# Patient Record
Sex: Male | Born: 1955 | Race: Black or African American | Hispanic: No | Marital: Married | Smoking: Never smoker
Health system: Southern US, Community
[De-identification: ages and names within clinical notes are randomized; demographics above are authoritative.]

## PROBLEM LIST (undated history)

## (undated) DIAGNOSIS — R011 Cardiac murmur, unspecified: Secondary | ICD-10-CM

## (undated) DIAGNOSIS — M1990 Unspecified osteoarthritis, unspecified site: Secondary | ICD-10-CM

## (undated) DIAGNOSIS — I10 Essential (primary) hypertension: Secondary | ICD-10-CM

## (undated) HISTORY — PX: TESTICLE SURGERY: SHX794

## (undated) HISTORY — PX: LEG SURGERY: SHX1003

## (undated) HISTORY — PX: SHOULDER SURGERY: SHX246

---

## 2018-11-15 ENCOUNTER — Encounter (HOSPITAL_COMMUNITY): Payer: Self-pay | Admitting: Emergency Medicine

## 2018-11-15 ENCOUNTER — Emergency Department (HOSPITAL_COMMUNITY)
Admission: EM | Admit: 2018-11-15 | Discharge: 2018-11-15 | Disposition: A | Discharge status: Home / Self Care | Payer: Self-pay | Attending: Emergency Medicine | Admitting: Emergency Medicine

## 2018-11-15 ENCOUNTER — Other Ambulatory Visit: Payer: Self-pay

## 2018-11-15 ENCOUNTER — Emergency Department (HOSPITAL_COMMUNITY): Payer: Self-pay

## 2018-11-15 DIAGNOSIS — G8929 Other chronic pain: Secondary | ICD-10-CM | POA: Insufficient documentation

## 2018-11-15 DIAGNOSIS — M25552 Pain in left hip: Secondary | ICD-10-CM

## 2018-11-15 DIAGNOSIS — M160 Bilateral primary osteoarthritis of hip: Secondary | ICD-10-CM | POA: Insufficient documentation

## 2018-11-15 MED ORDER — MELOXICAM 7.5 MG PO TABS: 7.5000 mg | ORAL_TABLET | Freq: Every day | ORAL | 0 refills | Status: AC

## 2018-11-15 NOTE — ED Provider Notes (Signed)
[facility] EMERGENCY DEPARTMENT Provider Note   CSN: [id number] Arrival date & time: 11/15/18  1219     History   Chief Complaint Chief Complaint  Patient presents with   Hip Pain    HPI Lee Jenkins is a 63 y.o. male.  Presents emerged department with chief complaint of left hip pain.  Patient states he has had pain since for the last few years, has seem to be worsening over the last 6 months or so.  States pain is worse when he is very active.  States mostly only having pain with movement but does have some pain with rest, sharp, stabbing, grinding sensation.  Has not taken medicine for this recently.  Patient works as a Museum/gallery exhibitions officer, frequently on feet during the day.  Denies any associated swelling, fever, no recent falls.  Denies any chronic medical problems.  Does not have a primary care doctor.  Has not seen an orthopedist regarding this complaint.     HPI  History reviewed. No pertinent past medical history.  There are no active problems to display for this patient.   Past Surgical History:  Procedure Laterality Date   LEG SURGERY     SHOULDER SURGERY     TESTICLE SURGERY          Home Medications    Prior to Admission medications   Medication Sig Start Date End Date Taking? Authorizing Provider  meloxicam (MOBIC) 7.5 MG tablet Take 1 tablet (7.5 mg total) by mouth daily for 14 days. 11/15/18 11/29/18  Milagros Loll, MD    Family History No family history on file.  Social History Social History   Tobacco Use   Smoking status: Never Smoker   Smokeless tobacco: Never Used  Substance Use Topics   Alcohol use: Never    Frequency: Never   Drug use: Never     Allergies   Patient has no known allergies.   Review of Systems Review of Systems  Constitutional: Negative for chills and fever.  HENT: Negative for ear pain and sore throat.   Eyes: Negative for pain and visual disturbance.  Respiratory: Negative for  cough and shortness of breath.   Cardiovascular: Negative for chest pain and palpitations.  Gastrointestinal: Negative for abdominal pain and vomiting.  Genitourinary: Negative for dysuria and hematuria.  Musculoskeletal: Positive for arthralgias. Negative for back pain.  Skin: Negative for color change and rash.  Neurological: Negative for seizures and syncope.  All other systems reviewed and are negative.    Physical Exam Updated Vital Signs BP (!) 173/92 (BP Location: Left Arm)    Pulse 66    Temp 98.2 F (36.8 C) (Oral)    Resp 16    SpO2 100%   Physical Exam Vitals signs and nursing note reviewed.  Constitutional:      Appearance: He is well-developed.  HENT:     Head: Normocephalic and atraumatic.  Eyes:     Conjunctiva/sclera: Conjunctivae normal.  Neck:     Musculoskeletal: Neck supple.  Cardiovascular:     Rate and Rhythm: Normal rate and regular rhythm.     Heart sounds: No murmur.  Pulmonary:     Effort: Pulmonary effort is normal. No respiratory distress.     Breath sounds: Normal breath sounds.  Abdominal:     Palpations: Abdomen is soft.     Tenderness: There is no abdominal tenderness.  Musculoskeletal:     Comments: LLE: Tenderness to palpation over left lateral hip, no  obvious deformity noted, normal joint range of motion, no swelling; no tenderness or deformity noted throughout rest of extremity, distal sensation, pulses intact  Skin:    General: Skin is warm and dry.     Capillary Refill: Capillary refill takes less than 2 seconds.  Neurological:     General: No focal deficit present.     Mental Status: He is alert.      ED Treatments / Results  Labs (all labs ordered are listed, but only abnormal results are displayed) Labs Reviewed - No data to display  EKG None  Radiology Dg Hip Unilat W Or Wo Pelvis 2-3 Views Left  Result Date: 11/15/2018 CLINICAL DATA:  Increasing left hip pain since an injury in 2012. EXAM: DG HIP (WITH OR WITHOUT  PELVIS) 2-3V LEFT COMPARISON:  None. FINDINGS: Severe left hip osteoarthritis with superolateral joint space loss with bone-on-bone contact, spurring, and subchondral sclerosis. There is also superolateral right hip narrowing. L4-5 advanced disc degeneration. No acute fracture, erosion, or evident bone lesion. IMPRESSION: Left more than right hip osteoarthritis, severe on the left. Electronically Signed   By: Monte Fantasia M.D.   On: 11/15/2018 06:21    Procedures Procedures (including critical care time)  Medications Ordered in ED Medications - No data to display   Initial Impression / Assessment and Plan / ED Course  I have reviewed the triage vital signs and the nursing notes.  Pertinent labs & imaging results that were available during my care of the patient were reviewed by me and considered in my medical decision making (see chart for details).        63 year old male presents with chronic left hip pain.  No recent injuries, no signs or symptoms for infection.  X-ray concerning for osteoarthritis.  This is consistent with his clinical presentation.  I strongly recommended follow-up with orthopedic surgery for further evaluation.  Recommend trial of NSAIDs.  Will give Rx for trial of Mobic.  Reviewed return precautions, will discharge home at this time.    After the discussed management above, the patient was determined to be safe for discharge.  The patient was in agreement with this plan and all questions regarding their care were answered.  ED return precautions were discussed and the patient will return to the ED with any significant worsening of condition.    Final Clinical Impressions(s) / ED Diagnoses   Final diagnoses:  Hip pain, chronic, left  Osteoarthritis of both hips, unspecified osteoarthritis type    ED Discharge Orders         Ordered    meloxicam (MOBIC) 7.5 MG tablet  Daily     11/15/18 0835           Lucrezia Starch, MD 11/15/18 [phone]

## 2018-11-15 NOTE — ED Triage Notes (Signed)
Patient reports chronic left hip pain since 2012 from a sports injury , pain increased today , denies recent fall or injury. Ambulatory using a cane.

## 2018-11-15 NOTE — Discharge Instructions (Signed)
Strongly recommend following up with orthopedic surgery regarding your severe hip pain.  Recommend trial of anti-inflammatories.  Have prescribed strong NSAID, recommend taking this daily for the next 1 to 2 weeks.  If you have falls, worsening pain, develop swelling, fever, other new concerning symptom recommend return to ER for recheck.

## 2019-03-12 ENCOUNTER — Ambulatory Visit: Payer: Self-pay | Admitting: Orthopedic Surgery

## 2019-03-13 ENCOUNTER — Other Ambulatory Visit (HOSPITAL_COMMUNITY): Payer: Self-pay | Admitting: *Deleted

## 2019-03-13 NOTE — Patient Instructions (Addendum)
DUE TO COVID-19 ONLY ONE VISITOR IS ALLOWED TO COME WITH YOU AND STAY IN THE WAITING ROOM ONLY DURING PRE OP AND PROCEDURE DAY OF SURGERY. THE 1 VISITOR MAY VISIT WITH YOU AFTER SURGERY IN YOUR PRIVATE ROOM DURING VISITING HOURS ONLY!  YOU NEED TO HAVE A COVID 19 TEST ON_Saturday 02/20/2021______ @__10 :45 AM_____, THIS TEST MUST BE DONE BEFORE SURGERY, COME  [place] , [phone].  ([place]) ONCE YOUR COVID TEST IS COMPLETED, PLEASE BEGIN THE QUARANTINE INSTRUCTIONS AS OUTLINED IN YOUR HANDOUT.                Lee Jenkins     Your procedure is scheduled on: Wednesday 03/20/2019   Report to [facility] Main  Entrance    Report to Short Stay at [place]  AM     Call this number if you have problems the morning of surgery [phone]    Remember: Do not eat food  :After Midnight.    NO SOLID FOOD AFTER MIDNIGHT THE NIGHT PRIOR TO SURGERY  And  NOTHING BY MOUTH EXCEPT CLEAR LIQUIDS UNTIL  0430 am .     PLEASE FINISH ENSURE DRINK PER SURGEON ORDER  WHICH NEEDS TO BE COMPLETED AT  0430 am  .   CLEAR LIQUID DIET   Foods Allowed                                                                     Foods Excluded  Coffee and tea, regular and decaf                             liquids that you cannot  Plain Jell-O any favor except red or purple                                           see through such as: Fruit ices (not with fruit pulp)                                     milk, soups, orange juice  Iced Popsicles                                    All solid food Carbonated beverages, regular and diet                                    Cranberry, grape and apple juices Sports drinks like Gatorade Lightly seasoned clear broth or consume(fat free) Sugar, honey syrup  Sample Menu Breakfast                                Lunch  Supper Cranberry juice                    Beef broth                             Chicken broth Jell-O                                     Grape juice                           Apple juice Coffee or tea                        Jell-O                                      Popsicle                                                Coffee or tea                        Coffee or tea  _____________________________________________________________________      BRUSH YOUR TEETH MORNING OF SURGERY AND RINSE YOUR MOUTH OUT, NO CHEWING GUM CANDY OR MINTS.     Take these medicines the morning of surgery with A SIP OF WATER: none                                 You may not have any metal on your body including hair pins and              piercings  Do not wear jewelry, make-up, lotions, powders or perfumes, deodorant                          Men may shave face and neck.   Do not bring valuables to the hospital. [facility] IS NOT             RESPONSIBLE   FOR VALUABLES.  Contacts, dentures or bridgework may not be worn into surgery.  Leave suitcase in the car. After surgery it may be brought to your room.                Please read over the following fact sheets you were given: _____________________________________________________________________             [facility] - Preparing for Surgery Before surgery, you can play an important role.  Because skin is not sterile, your skin needs to be as free of germs as possible.  You can reduce the number of germs on your skin by washing with CHG (chlorahexidine gluconate) soap before surgery.  CHG is an antiseptic cleaner which kills germs and bonds with the skin to continue killing germs even after washing. Please DO NOT use if you have an allergy to CHG or antibacterial soaps.  If your skin becomes reddened/irritated stop using the CHG and inform your nurse when you  arrive at Short Stay. Do not shave (including legs and underarms) for at least 48 hours prior to the first CHG shower.  You may shave your face/neck. Please follow these  instructions carefully:  1.  Shower with CHG Soap the night before surgery and the  morning of Surgery.  2.  If you choose to wash your hair, wash your hair first as usual with your  normal  shampoo.  3.  After you shampoo, rinse your hair and body thoroughly to remove the  shampoo.                           4.  Use CHG as you would any other liquid soap.  You can apply chg directly  to the skin and wash                       Gently with a scrungie or clean washcloth.  5.  Apply the CHG Soap to your body ONLY FROM THE NECK DOWN.   Do not use on face/ open                           Wound or open sores. Avoid contact with eyes, ears mouth and genitals (private parts).                       Wash face,  Genitals (private parts) with your normal soap.             6.  Wash thoroughly, paying special attention to the area where your surgery  will be performed.  7.  Thoroughly rinse your body with warm water from the neck down.  8.  DO NOT shower/wash with your normal soap after using and rinsing off  the CHG Soap.                9.  Pat yourself dry with a clean towel.            10.  Wear clean pajamas.            11.  Place clean sheets on your bed the night of your first shower and do not  sleep with pets. Day of Surgery : Do not apply any lotions/deodorants the morning of surgery.  Please wear clean clothes to the hospital/surgery center.  FAILURE TO FOLLOW THESE INSTRUCTIONS MAY RESULT IN THE CANCELLATION OF YOUR SURGERY PATIENT SIGNATURE_________________________________  NURSE SIGNATURE__________________________________  ________________________________________________________________________   Lee Jenkins  An incentive spirometer is a tool that can help keep your lungs clear and active. This tool measures how well you are filling your lungs with each breath. Taking long deep breaths may help reverse or decrease the chance of developing breathing (pulmonary) problems (especially  infection) following:  A long period of time when you are unable to move or be active. BEFORE THE PROCEDURE   If the spirometer includes an indicator to show your best effort, your nurse or respiratory therapist will set it to a desired goal.  If possible, sit up straight or lean slightly forward. Try not to slouch.  Hold the incentive spirometer in an upright position. INSTRUCTIONS FOR USE  1. Sit on the edge of your bed if possible, or sit up as far as you can in bed or on a chair. 2. Hold the incentive spirometer in an upright position. 3. Breathe out normally.  4. Place the mouthpiece in your mouth and seal your lips tightly around it. 5. Breathe in slowly and as deeply as possible, raising the piston or the ball toward the top of the column. 6. Hold your breath for 3-5 seconds or for as long as possible. Allow the piston or ball to fall to the bottom of the column. 7. Remove the mouthpiece from your mouth and breathe out normally. 8. Rest for a few seconds and repeat Steps 1 through 7 at least 10 times every 1-2 hours when you are awake. Take your time and take a few normal breaths between deep breaths. 9. The spirometer may include an indicator to show your best effort. Use the indicator as a goal to work toward during each repetition. 10. After each set of 10 deep breaths, practice coughing to be sure your lungs are clear. If you have an incision (the cut made at the time of surgery), support your incision when coughing by placing a pillow or rolled up towels firmly against it. Once you are able to get out of bed, walk around indoors and cough well. You may stop using the incentive spirometer when instructed by your caregiver.  RISKS AND COMPLICATIONS  Take your time so you do not get dizzy or light-headed.  If you are in pain, you may need to take or ask for pain medication before doing incentive spirometry. It is harder to take a deep breath if you are having pain. AFTER  USE  Rest and breathe slowly and easily.  It can be helpful to keep track of a log of your progress. Your caregiver can provide you with a simple table to help with this. If you are using the spirometer at home, follow these instructions: [place] IF:   You are having difficultly using the spirometer.  You have trouble using the spirometer as often as instructed.  Your pain medication is not giving enough relief while using the spirometer.  You develop fever of 100.5 F (38.1 C) or higher. SEEK IMMEDIATE MEDICAL CARE IF:   You cough up bloody sputum that had not been present before.  You develop fever of 102 F (38.9 C) or greater.  You develop worsening pain at or near the incision site. MAKE SURE YOU:   Understand these instructions.  Will watch your condition.  Will get help right away if you are not doing well or get worse. Document Released: 05/23/2006 Document Revised: 04/04/2011 Document Reviewed: 07/24/2006 Exit[employer] Patient Information 2014 [employer], [place].   ________________________________________________________________________  WHAT IS A BLOOD TRANSFUSION? Blood Transfusion Information  A transfusion is the replacement of blood or some of its parts. Blood is made up of multiple cells which provide different functions.  Red blood cells carry oxygen and are used for blood loss replacement.  White blood cells fight against infection.  Platelets control bleeding.  Plasma helps clot blood.  Other blood products are available for specialized needs, such as hemophilia or other clotting disorders. BEFORE THE TRANSFUSION  Who gives blood for transfusions?   Healthy volunteers who are fully evaluated to make sure their blood is safe. This is blood bank blood. Transfusion therapy is the safest it has ever been in the practice of medicine. Before blood is taken from a donor, a complete history is taken to make sure that person has no history of diseases  nor engages in risky social behavior (examples are intravenous drug use or sexual activity with multiple partners). The donor's travel history  is screened to minimize risk of transmitting infections, such as malaria. The donated blood is tested for signs of infectious diseases, such as HIV and hepatitis. The blood is then tested to be sure it is compatible with you in order to minimize the chance of a transfusion reaction. If you or a relative donates blood, this is often done in anticipation of surgery and is not appropriate for emergency situations. It takes many days to process the donated blood. RISKS AND COMPLICATIONS Although transfusion therapy is very safe and saves many lives, the main dangers of transfusion include:   Getting an infectious disease.  Developing a transfusion reaction. This is an allergic reaction to something in the blood you were given. Every precaution is taken to prevent this. The decision to have a blood transfusion has been considered carefully by your caregiver before blood is given. Blood is not given unless the benefits outweigh the risks. AFTER THE TRANSFUSION  Right after receiving a blood transfusion, you will usually feel much better and more energetic. This is especially true if your red blood cells have gotten low (anemic). The transfusion raises the level of the red blood cells which carry oxygen, and this usually causes an energy increase.  The nurse administering the transfusion will monitor you carefully for complications. HOME CARE INSTRUCTIONS  No special instructions are needed after a transfusion. You may find your energy is better. Speak with your caregiver about any limitations on activity for underlying diseases you may have. SEEK MEDICAL CARE IF:   Your condition is not improving after your transfusion.  You develop redness or irritation at the intravenous (IV) site. SEEK IMMEDIATE MEDICAL CARE IF:  Any of the following symptoms occur over the  next 12 hours:  Shaking chills.  You have a temperature by mouth above 102 F (38.9 C), not controlled by medicine.  Chest, back, or muscle pain.  People around you feel you are not acting correctly or are confused.  Shortness of breath or difficulty breathing.  Dizziness and fainting.  You get a rash or develop hives.  You have a decrease in urine output.  Your urine turns a dark color or changes to pink, red, or brown. Any of the following symptoms occur over the next 10 days:  You have a temperature by mouth above 102 F (38.9 C), not controlled by medicine.  Shortness of breath.  Weakness after normal activity.  The white part of the eye turns yellow (jaundice).  You have a decrease in the amount of urine or are urinating less often.  Your urine turns a dark color or changes to pink, red, or brown. Document Released: 01/08/2000 Document Revised: 04/04/2011 Document Reviewed: 08/27/2007 [facility] Patient Information 2014 [place], [place].  _______________________________________________________________________

## 2019-03-14 ENCOUNTER — Encounter (HOSPITAL_COMMUNITY): Payer: Self-pay

## 2019-03-14 ENCOUNTER — Encounter (HOSPITAL_COMMUNITY)
Admission: RE | Admit: 2019-03-14 | Discharge: 2019-03-14 | Disposition: A | Discharge status: Home / Self Care | Payer: PRIVATE HEALTH INSURANCE | Source: Ambulatory Visit | Attending: Orthopedic Surgery | Admitting: Orthopedic Surgery

## 2019-03-14 ENCOUNTER — Other Ambulatory Visit: Payer: Self-pay

## 2019-03-14 DIAGNOSIS — Z01818 Encounter for other preprocedural examination: Secondary | ICD-10-CM | POA: Insufficient documentation

## 2019-03-14 HISTORY — DX: Unspecified osteoarthritis, unspecified site: M19.90

## 2019-03-14 HISTORY — DX: Cardiac murmur, unspecified: R01.1

## 2019-03-14 HISTORY — DX: Essential (primary) hypertension: I10

## 2019-03-14 NOTE — Progress Notes (Addendum)
PCP - Dr. Yves Dill  02/14/2019 epic, office visit on chart from 02/25/2019 Cardiologist -   Chest x-ray - 02/14/2019 on chart from Dr. Welton Flakes  3 views EKG - 02/18/2019 on chart Dr. Earmon [place] Test - 03/01/2019 on chart Dr. Welton Flakes Nuclear stress- 02/27/2019 on chart Dr. Welton Flakes ECHO - 02/18/2019 on chart Dr. Welton Flakes Cardiac Cath - n/a  Sleep Study - n/a CPAP - n/a  Fasting Blood Sugar - n/a Checks Blood Sugar ___0__ times a day  Blood Thinner Instructions:n/a Aspirin Instructions:n/a Last Dose:n/a  Anesthesia review:   Chart to be reviewed by Jodell Cipro, PA  Patient has a history of heart murmur and HTN. Patient was to be seen by a cardiologist prior to surgery. Notes to be Faxed over.  Patient denies shortness of breath, fever, cough and chest pain at PAT appointment  Patient verbalized understanding of instructions that were given to them at the PAT appointment. Patient was also instructed that they will need to review over the PAT instructions again at home before surgery.

## 2019-03-15 ENCOUNTER — Ambulatory Visit: Payer: Self-pay | Admitting: Orthopedic Surgery

## 2019-03-15 ENCOUNTER — Encounter: Payer: Self-pay | Admitting: Urology

## 2019-03-15 ENCOUNTER — Ambulatory Visit (INDEPENDENT_AMBULATORY_CARE_PROVIDER_SITE_OTHER): Payer: No Typology Code available for payment source | Admitting: Urology

## 2019-03-15 ENCOUNTER — Other Ambulatory Visit: Payer: Self-pay | Admitting: Internal Medicine

## 2019-03-15 VITALS — BP 148/84 | HR 94 | Ht 73.0 in | Wt 189.0 lb

## 2019-03-15 DIAGNOSIS — N138 Other obstructive and reflux uropathy: Secondary | ICD-10-CM

## 2019-03-15 DIAGNOSIS — R3915 Urgency of urination: Secondary | ICD-10-CM

## 2019-03-15 DIAGNOSIS — R3912 Poor urinary stream: Secondary | ICD-10-CM

## 2019-03-15 DIAGNOSIS — N401 Enlarged prostate with lower urinary tract symptoms: Secondary | ICD-10-CM

## 2019-03-15 LAB — URINALYSIS, COMPLETE
Bilirubin, UA: NEGATIVE
Glucose, UA: NEGATIVE
Ketones, UA: NEGATIVE
Leukocytes,UA: NEGATIVE
Nitrite, UA: NEGATIVE
Protein,UA: NEGATIVE
Specific Gravity, UA: 1.015 (ref 1.005–1.030)
Urobilinogen, Ur: 0.2 mg/dL (ref 0.2–1.0)
pH, UA: 6 (ref 5.0–7.5)

## 2019-03-15 LAB — BLADDER SCAN AMB NON-IMAGING: Scan Result: 22

## 2019-03-15 LAB — MICROSCOPIC EXAMINATION: Epithelial Cells (non renal): NONE SEEN /hpf (ref 0–10)

## 2019-03-15 MED ORDER — TAMSULOSIN HCL 0.4 MG PO CAPS: 0.4000 mg | ORAL_CAPSULE | Freq: Every day | ORAL | 11 refills | Status: DC

## 2019-03-15 NOTE — H&P (Signed)
TOTAL HIP ADMISSION H&P  Patient is admitted for left total hip arthroplasty.  Subjective:  Chief Complaint: left hip pain  HPI: Lee Jenkins, 64 y.o. male, has a history of pain and functional disability in the left hip(s) due to arthritis and patient has failed non-surgical conservative treatments for greater than 12 weeks to include NSAID's and/or analgesics, corticosteriod injections, flexibility and strengthening excercises, use of assistive devices, weight reduction as appropriate and activity modification.  Onset of symptoms was gradual starting 5 years ago with gradually worsening course since that time.The patient noted no past surgery on the left hip(s).  Patient currently rates pain in the left hip at 10 out of 10 with activity. Patient has night pain, worsening of pain with activity and weight bearing, trendelenberg gait, pain that interfers with activities of daily living, pain with passive range of motion and crepitus. Patient has evidence of subchondral cysts, subchondral sclerosis, periarticular osteophytes and joint space narrowing by imaging studies. This condition presents safety issues increasing the risk of falls. There is no current active infection.  There are no problems to display for this patient.  Past Medical History:  Diagnosis Date  . Arthritis   . Heart murmur    told initially but after cardiac tests-everything was fine  . Hypertension     Past Surgical History:  Procedure Laterality Date  . LEG SURGERY    . SHOULDER SURGERY    . TESTICLE SURGERY      Current Outpatient Medications  Medication Sig Dispense Refill Last Dose  . lisinopril-hydrochlorothiazide (ZESTORETIC) 20-25 MG tablet Take 1 tablet by mouth daily before breakfast.     . traMADol (ULTRAM) 50 MG tablet Take 50 mg by mouth at bedtime as needed for moderate pain.      No current facility-administered medications for this visit.   No Known Allergies  Social History   Tobacco Use  .  Smoking status: Never Smoker  . Smokeless tobacco: Never Used  Substance Use Topics  . Alcohol use: Never    No family history on file.   Review of Systems  Constitutional: Negative.   HENT: Negative.   Eyes: Negative.   Respiratory: Negative.   Cardiovascular: Negative.   Gastrointestinal: Negative.   Endocrine: Negative.   Genitourinary: Negative.   Musculoskeletal: Positive for arthralgias.  Skin: Negative.   Allergic/Immunologic: Negative.   Neurological: Negative.   Hematological: Negative.   Psychiatric/Behavioral: Negative.     Objective:  Physical Exam  Vitals reviewed. Constitutional: He is oriented to person, place, and time. He appears well-developed and well-nourished.  HENT:  Head: Normocephalic and atraumatic.  Eyes: Pupils are equal, round, and reactive to light. Conjunctivae and EOM are normal.  Neck: No thyromegaly present.  Cardiovascular: Normal rate, regular rhythm and intact distal pulses.  Respiratory: Effort normal. No respiratory distress.  GI: Soft. He exhibits no distension.  Genitourinary:    Genitourinary Comments: deferred   Musculoskeletal:     Cervical back: Normal range of motion.     Left hip: Bony tenderness present. Decreased range of motion.  Neurological: He is alert and oriented to person, place, and time. He has normal reflexes.  Skin: Skin is warm and dry.  Psychiatric: He has a normal mood and affect. His behavior is normal. Judgment and thought content normal.    Vital signs in last 24 hours: @VSRANGES @  Labs:   Estimated body mass index is 25.07 kg/m as calculated from the following:   Height as of 03/14/19: 6\' 1"  (  1.854 m).   Weight as of 03/14/19: 86.2 kg.   Imaging Review Plain radiographs demonstrate severe degenerative joint disease of the left hip(s). The bone quality appears to be adequate for age and reported activity level.      Assessment/Plan:  End stage arthritis, left hip(s)  The patient  history, physical examination, clinical judgement of the provider and imaging studies are consistent with end stage degenerative joint disease of the left hip(s) and total hip arthroplasty is deemed medically necessary. The treatment options including medical management, injection therapy, arthroscopy and arthroplasty were discussed at length. The risks and benefits of total hip arthroplasty were presented and reviewed. The risks due to aseptic loosening, infection, stiffness, dislocation/subluxation,  thromboembolic complications and other imponderables were discussed.  The patient acknowledged the explanation, agreed to proceed with the plan and consent was signed. Patient is being admitted for inpatient treatment for surgery, pain control, PT, OT, prophylactic antibiotics, VTE prophylaxis, progressive ambulation and ADL's and discharge planning.The patient is planning to be discharged home with HEP    Patient's anticipated LOS is less than 2 midnights, meeting these requirements: - Younger than 58 - Lives within 1 hour of care - Has a competent adult at home to recover with post-op recover - NO history of  - Chronic pain requiring opiods  - Diabetes  - Coronary Artery Disease  - Heart failure  - Heart attack  - Stroke  - DVT/VTE  - Cardiac arrhythmia  - Respiratory Failure/COPD  - Renal failure  - Anemia  - Advanced Liver disease

## 2019-03-15 NOTE — H&P (View-Only) (Signed)
TOTAL HIP ADMISSION H&P  Patient is admitted for left total hip arthroplasty.  Subjective:  Chief Complaint: left hip pain  HPI: Lee Jenkins, 64 y.o. male, has a history of pain and functional disability in the left hip(s) due to arthritis and patient has failed non-surgical conservative treatments for greater than 12 weeks to include NSAID's and/or analgesics, corticosteriod injections, flexibility and strengthening excercises, use of assistive devices, weight reduction as appropriate and activity modification.  Onset of symptoms was gradual starting 5 years ago with gradually worsening course since that time.The patient noted no past surgery on the left hip(s).  Patient currently rates pain in the left hip at 10 out of 10 with activity. Patient has night pain, worsening of pain with activity and weight bearing, trendelenberg gait, pain that interfers with activities of daily living, pain with passive range of motion and crepitus. Patient has evidence of subchondral cysts, subchondral sclerosis, periarticular osteophytes and joint space narrowing by imaging studies. This condition presents safety issues increasing the risk of falls. There is no current active infection.  There are no problems to display for this patient.  Past Medical History:  Diagnosis Date  . Arthritis   . Heart murmur    told initially but after cardiac tests-everything was fine  . Hypertension     Past Surgical History:  Procedure Laterality Date  . LEG SURGERY    . SHOULDER SURGERY    . TESTICLE SURGERY      Current Outpatient Medications  Medication Sig Dispense Refill Last Dose  . lisinopril-hydrochlorothiazide (ZESTORETIC) 20-25 MG tablet Take 1 tablet by mouth daily before breakfast.     . traMADol (ULTRAM) 50 MG tablet Take 50 mg by mouth at bedtime as needed for moderate pain.      No current facility-administered medications for this visit.   No Known Allergies  Social History   Tobacco Use  .  Smoking status: Never Smoker  . Smokeless tobacco: Never Used  Substance Use Topics  . Alcohol use: Never    No family history on file.   Review of Systems  Constitutional: Negative.   HENT: Negative.   Eyes: Negative.   Respiratory: Negative.   Cardiovascular: Negative.   Gastrointestinal: Negative.   Endocrine: Negative.   Genitourinary: Negative.   Musculoskeletal: Positive for arthralgias.  Skin: Negative.   Allergic/Immunologic: Negative.   Neurological: Negative.   Hematological: Negative.   Psychiatric/Behavioral: Negative.     Objective:  Physical Exam  Vitals reviewed. Constitutional: He is oriented to person, place, and time. He appears well-developed and well-nourished.  HENT:  Head: Normocephalic and atraumatic.  Eyes: Pupils are equal, round, and reactive to light. Conjunctivae and EOM are normal.  Neck: No thyromegaly present.  Cardiovascular: Normal rate, regular rhythm and intact distal pulses.  Respiratory: Effort normal. No respiratory distress.  GI: Soft. He exhibits no distension.  Genitourinary:    Genitourinary Comments: deferred   Musculoskeletal:     Cervical back: Normal range of motion.     Left hip: Bony tenderness present. Decreased range of motion.  Neurological: He is alert and oriented to person, place, and time. He has normal reflexes.  Skin: Skin is warm and dry.  Psychiatric: He has a normal mood and affect. His behavior is normal. Judgment and thought content normal.    Vital signs in last 24 hours: @VSRANGES@  Labs:   Estimated body mass index is 25.07 kg/m as calculated from the following:   Height as of 03/14/19: 6' 1" (  1.854 m).   Weight as of 03/14/19: 86.2 kg.   Imaging Review Plain radiographs demonstrate severe degenerative joint disease of the left hip(s). The bone quality appears to be adequate for age and reported activity level.      Assessment/Plan:  End stage arthritis, left hip(s)  The patient  history, physical examination, clinical judgement of the provider and imaging studies are consistent with end stage degenerative joint disease of the left hip(s) and total hip arthroplasty is deemed medically necessary. The treatment options including medical management, injection therapy, arthroscopy and arthroplasty were discussed at length. The risks and benefits of total hip arthroplasty were presented and reviewed. The risks due to aseptic loosening, infection, stiffness, dislocation/subluxation,  thromboembolic complications and other imponderables were discussed.  The patient acknowledged the explanation, agreed to proceed with the plan and consent was signed. Patient is being admitted for inpatient treatment for surgery, pain control, PT, OT, prophylactic antibiotics, VTE prophylaxis, progressive ambulation and ADL's and discharge planning.The patient is planning to be discharged home with HEP    Patient's anticipated LOS is less than 2 midnights, meeting these requirements: - Younger than 58 - Lives within 1 hour of care - Has a competent adult at home to recover with post-op recover - NO history of  - Chronic pain requiring opiods  - Diabetes  - Coronary Artery Disease  - Heart failure  - Heart attack  - Stroke  - DVT/VTE  - Cardiac arrhythmia  - Respiratory Failure/COPD  - Renal failure  - Anemia  - Advanced Liver disease

## 2019-03-15 NOTE — Patient Instructions (Signed)
Overactive Bladder, Adult ° °Overactive bladder refers to a condition in which a person has a sudden need to pass urine. The person may leak urine if he or she cannot get to the bathroom fast enough (urinary incontinence). A person with this condition may also wake up several times in the night to go to the bathroom. °Overactive bladder is associated with poor nerve signals between your bladder and your brain. Your bladder may get the signal to empty before it is full. You may also have very sensitive muscles that make your bladder squeeze too soon. These symptoms might interfere with daily work or social activities. °What are the causes? °This condition may be associated with or caused by: °· Urinary tract infection. °· Infection of nearby tissues, such as the prostate. °· Prostate enlargement. °· Surgery on the uterus or urethra. °· Bladder stones, inflammation, or tumors. °· Drinking too much caffeine or alcohol. °· Certain medicines, especially medicines that get rid of extra fluid in the body (diuretics). °· Muscle or nerve weakness, especially from: °? A spinal cord injury. °? Stroke. °? Multiple sclerosis. °? Parkinson's disease. °· Diabetes. °· Constipation. °What increases the risk? °You may be at greater risk for overactive bladder if you: °· Are an older adult. °· Smoke. °· Are going through menopause. °· Have prostate problems. °· Have a neurological disease, such as stroke, dementia, Parkinson's disease, or multiple sclerosis (MS). °· Eat or drink things that irritate the bladder. These include alcohol, spicy food, and caffeine. °· Are overweight or obese. °What are the signs or symptoms? °Symptoms of this condition include: °· Sudden, strong urge to urinate. °· Leaking urine. °· Urinating 8 or more times a day. °· Waking up to urinate 2 or more times a night. °How is this diagnosed? °Your health care provider may suspect overactive bladder based on your symptoms. He or she will diagnose this condition  by: °· A physical exam and medical history. °· Blood or urine tests. You might need bladder or urine tests to help determine what is causing your overactive bladder. °You might also need to see a health care provider who specializes in urinary tract problems (urologist). °How is this treated? °Treatment for overactive bladder depends on the cause of your condition and whether it is mild or severe. You can also make lifestyle changes at home. Options include: °· Bladder training. This may include: °? Learning to control the urge to urinate by following a schedule that directs you to urinate at regular intervals (timed voiding). °? Doing Kegel exercises to strengthen your pelvic floor muscles, which support your bladder. Toning these muscles can help you control urination, even if your bladder muscles are overactive. °· Special devices. This may include: °? Biofeedback, which uses sensors to help you become aware of your body's signals. °? Electrical stimulation, which uses electrodes placed inside the body (implanted) or outside the body. These electrodes send gentle pulses of electricity to strengthen the nerves or muscles that control the bladder. °? Women may use a plastic device that fits into the vagina and supports the bladder (pessary). °· Medicines. °? Antibiotics to treat bladder infection. °? Antispasmodics to stop the bladder from releasing urine at the wrong time. °? Tricyclic antidepressants to relax bladder muscles. °? Injections of botulinum toxin type A directly into the bladder tissue to relax bladder muscles. °· Lifestyle changes. This may include: °? Weight loss. Talk to your health care provider about weight loss methods that would work best for you. °?   Diet changes. This may include reducing how much alcohol and caffeine you consume, or drinking fluids at different times of the day. °? Not smoking. Do not use any products that contain nicotine or tobacco, such as cigarettes and e-cigarettes. If  you need help quitting, ask your health care provider. °· Surgery. °? A device may be implanted to help manage the nerve signals that control urination. °? An electrode may be implanted to stimulate electrical signals in the bladder. °? A procedure may be done to change the shape of the bladder. This is done only in very severe cases. °Follow these instructions at home: °Lifestyle °· Make any diet or lifestyle changes that are recommended by your health care provider. These may include: °? Drinking less fluid or drinking fluids at different times of the day. °? Cutting down on caffeine or alcohol. °? Doing Kegel exercises. °? Losing weight if needed. °? Eating a healthy and balanced diet to prevent constipation. This may include: °§ Eating foods that are high in fiber, such as fresh fruits and vegetables, whole grains, and beans. °§ Limiting foods that are high in fat and processed sugars, such as fried and sweet foods. °General instructions °· Take over-the-counter and prescription medicines only as told by your health care provider. °· If you were prescribed an antibiotic medicine, take it as told by your health care provider. Do not stop taking the antibiotic even if you start to feel better. °· Use any implants or pessary as told by your health care provider. °· If needed, wear pads to absorb urine leakage. °· Keep a journal or log to track how much and when you drink and when you feel the need to urinate. This will help your health care provider monitor your condition. °· Keep all follow-up visits as told by your health care provider. This is important. °Contact a health care provider if: °· You have a fever. °· Your symptoms do not get better with treatment. °· Your pain and discomfort get worse. °· You have more frequent urges to urinate. °Get help right away if: °· You are not able to control your bladder. °Summary °· Overactive bladder refers to a condition in which a person has a sudden need to pass  urine. °· Several conditions may lead to an overactive bladder. °· Treatment for overactive bladder depends on the cause and severity of your condition. °· Follow your health care provider's instructions about lifestyle changes, doing Kegel exercises, keeping a journal, and taking medicines. °This information is not intended to replace advice given to you by your health care provider. Make sure you discuss any questions you have with your health care provider. °Document Revised: 05/03/2018 Document Reviewed: 01/26/2017 °Elsevier Patient Education © 2020 [employer] °Benign Prostatic Hyperplasia ° °Benign prostatic hyperplasia (BPH) is an enlarged prostate gland that is caused by the normal aging process and not by cancer. The prostate is a walnut-sized gland that is involved in the production of semen. It is located in front of the rectum and below the bladder. The bladder stores urine and the urethra is the tube that carries the urine out of the body. The prostate may get bigger as a man gets older. °An enlarged prostate can press on the urethra. This can make it harder to pass urine. The build-up of urine in the bladder can cause infection. Back pressure and infection may progress to bladder damage and kidney (renal) failure. °What are the causes? °This condition is part of a normal   aging process. However, not all men develop problems from this condition. If the prostate enlarges away from the urethra, urine flow will not be blocked. If it enlarges toward the urethra and compresses it, there will be problems passing urine. °What increases the risk? °This condition is more likely to develop in men over the age of 50 years. °What are the signs or symptoms? °Symptoms of this condition include: °· Getting up often during the night to urinate. °· Needing to urinate frequently during the day. °· Difficulty starting urine flow. °· Decrease in size and strength of your urine stream. °· Leaking (dribbling) after  urinating. °· Inability to pass urine. This needs immediate treatment. °· Inability to completely empty your bladder. °· Pain when you pass urine. This is more common if there is also an infection. °· Urinary tract infection (UTI). °How is this diagnosed? °This condition is diagnosed based on your medical history, a physical exam, and your symptoms. Tests will also be done, such as: °· A post-void bladder scan. This measures any amount of urine that may remain in your bladder after you finish urinating. °· A digital rectal exam. In a rectal exam, your health care provider checks your prostate by putting a lubricated, gloved finger into your rectum to feel the back of your prostate gland. This exam detects the size of your gland and any abnormal lumps or growths. °· An exam of your urine (urinalysis). °· A prostate specific antigen (PSA) screening. This is a blood test used to screen for prostate cancer. °· An ultrasound. This test uses sound waves to electronically produce a picture of your prostate gland. °Your health care provider may refer you to a specialist in kidney and prostate diseases (urologist). °How is this treated? °Once symptoms begin, your health care provider will monitor your condition (active surveillance or watchful waiting). Treatment for this condition will depend on the severity of your condition. Treatment may include: °· Observation and yearly exams. This may be the only treatment needed if your condition and symptoms are mild. °· Medicines to relieve your symptoms, including: °? Medicines to shrink the prostate. °? Medicines to relax the muscle of the prostate. °· Surgery in severe cases. Surgery may include: °? Prostatectomy. In this procedure, the prostate tissue is removed completely through an open incision or with a laparoscope or robotics. °? Transurethral resection of the prostate (TURP). In this procedure, a tool is inserted through the opening at the tip of the penis (urethra). It  is used to cut away tissue of the inner core of the prostate. The pieces are removed through the same opening of the penis. This removes the blockage. °? Transurethral incision (TUIP). In this procedure, small cuts are made in the prostate. This lessens the prostate's pressure on the urethra. °? Transurethral microwave thermotherapy (TUMT). This procedure uses microwaves to create heat. The heat destroys and removes a small amount of prostate tissue. °? Transurethral needle ablation (TUNA). This procedure uses radio frequencies to destroy and remove a small amount of prostate tissue. °? Interstitial laser coagulation (ILC). This procedure uses a laser to destroy and remove a small amount of prostate tissue. °? Transurethral electrovaporization (TUVP). This procedure uses electrodes to destroy and remove a small amount of prostate tissue. °? Prostatic urethral lift. This procedure inserts an implant to push the lobes of the prostate away from the urethra. °Follow these instructions at home: °· Take over-the-counter and prescription medicines only as told by your health care provider. °· Monitor   your symptoms for any changes. Contact your health care provider with any changes. °· Avoid drinking large amounts of liquid before going to bed or out in public. °· Avoid or reduce how much caffeine or alcohol you drink. °· Give yourself time when you urinate. °· Keep all follow-up visits as told by your health care provider. This is important. °Contact a health care provider if: °· You have unexplained back pain. °· Your symptoms do not get better with treatment. °· You develop side effects from the medicine you are taking. °· Your urine becomes very dark or has a bad smell. °· Your lower abdomen becomes distended and you have trouble passing your urine. °Get help right away if: °· You have a fever or chills. °· You suddenly cannot urinate. °· You feel lightheaded, or very dizzy, or you faint. °· There are large amounts of  blood or clots in the urine. °· Your urinary problems become hard to manage. °· You develop moderate to severe low back or flank pain. The flank is the side of your body between the ribs and the hip. °These symptoms may represent a serious problem that is an emergency. Do not wait to see if the symptoms will go away. Get medical help right away. Call your local emergency services (911 in the [place] Do not drive yourself to the hospital. °Summary °· Benign prostatic hyperplasia (BPH) is an enlarged prostate that is caused by the normal aging process and not by cancer. °· An enlarged prostate can press on the urethra. This can make it hard to pass urine. °· This condition is part of a normal aging process and is more likely to develop in men over the age of 50 years. °· Get help right away if you suddenly cannot urinate. °This information is not intended to replace advice given to you by your health care provider. Make sure you discuss any questions you have with your health care provider. °Document Revised: 12/05/2017 Document Reviewed: 02/15/2016 °Elsevier Patient Education © 2020 [employer] ° °

## 2019-03-15 NOTE — Progress Notes (Signed)
03/15/2019 1:32 PM   Lee Jenkins 11/16/1955 [id number]  Referring provider: Margaretann Loveless, MD [address] [place],  [place] [address]  Chief Complaint  Patient presents with  . Urinary Frequency    HPI:  [name] was referred for urinary frequency. Symptoms worse last 3 months as his left hip pain worsened. He gets a hip replacement soon. He has urgency especially with standing and turn key. Stream is intermittent - sometimes good, sometimes slow. He drinks water. No constipation. No NG risk.   PSA was 2.3 on 02/14/2019.  PVR 22 ml.   PMH: Past Medical History:  Diagnosis Date  . Arthritis   . Heart murmur    told initially but after cardiac tests-everything was fine  . Hypertension     Surgical History: Past Surgical History:  Procedure Laterality Date  . LEG SURGERY    . SHOULDER SURGERY    . TESTICLE SURGERY      Home Medications:  Allergies as of 03/15/2019   No Known Allergies     Medication List       Accurate as of March 15, 2019  1:32 PM. If you have any questions, ask your nurse or doctor.        lisinopril-hydrochlorothiazide 20-25 MG tablet Commonly known as: ZESTORETIC Take 1 tablet by mouth daily before breakfast.   traMADol 50 MG tablet Commonly known as: ULTRAM Take 50 mg by mouth at bedtime as needed for moderate pain.       Allergies: No Known Allergies  Family History: No family history on file.  Social History:  reports that he has never smoked. He has never used smokeless tobacco. He reports that he does not drink alcohol or use drugs.   Physical Exam: BP (!) 148/84 (BP Location: Left Arm, Patient Position: Sitting, Cuff Size: Large)   Pulse 94   Ht 6\' 1"  (1.854 m)   Wt 189 lb (85.7 kg)   BMI 24.94 kg/m   Constitutional:  Alert and oriented, No acute distress. HEENT: [place] AT, moist mucus membranes.  Trachea midline, no masses. Cardiovascular: No clubbing, cyanosis, or edema. Respiratory: Normal respiratory effort,  no increased work of breathing. GI: Abdomen is soft, nontender, nondistended, no abdominal masses, no inguinal hernia  GU: No CVA tenderness; Penis circumcised, normal foreskin, testicles descended bilaterally and palpably normal, bilateral epididymis palpably normal, scrotum normal DRE: Prostate 30 g, smooth without hard area or nodule Lymph: No cervical or inguinal lymphadenopathy. Skin: No rashes, bruises or suspicious lesions. Neurologic: Grossly intact, no focal deficits, moving all 4 extremities. Psychiatric: Normal mood and affect.  Laboratory Data: No results found for: WBC, HGB, HCT, MCV, PLT  No results found for: CREATININE  No results found for: PSA  No results found for: TESTOSTERONE  No results found for: HGBA1C  Urinalysis No results found for: COLORURINE, APPEARANCEUR, LABSPEC, PHURINE, GLUCOSEU, HGBUR, BILIRUBINUR, KETONESUR, PROTEINUR, UROBILINOGEN, NITRITE, LEUKOCYTESUR  No results found for: LABMICR, WBCUA, RBCUA, LABEPIT, MUCUS, BACTERIA  Pertinent Imaging: n/a No results found for this or any previous visit. No results found for this or any previous visit. No results found for this or any previous visit. No results found for this or any previous visit. No results found for this or any previous visit. No results found for this or any previous visit. No results found for this or any previous visit. No results found for this or any previous visit.  Assessment & Plan:    Bph, urgency and weak stream -  with hip surgery coming up it makes sense to start something like tamsulosin. We discussed the nature rb/a/ to alpha blockers and he will start. I'll see him back on about 2 months to check his symptoms.   No follow-ups on file.  Festus Aloe, MD  [facility] [address] [place], [place] [address] [phone]

## 2019-03-16 ENCOUNTER — Other Ambulatory Visit (HOSPITAL_COMMUNITY)
Admission: RE | Admit: 2019-03-16 | Discharge: 2019-03-16 | Disposition: A | Discharge status: Home / Self Care | Payer: PRIVATE HEALTH INSURANCE | Source: Ambulatory Visit | Attending: Orthopedic Surgery | Admitting: Orthopedic Surgery

## 2019-03-16 DIAGNOSIS — Z20822 Contact with and (suspected) exposure to covid-19: Secondary | ICD-10-CM | POA: Diagnosis not present

## 2019-03-16 DIAGNOSIS — Z01812 Encounter for preprocedural laboratory examination: Secondary | ICD-10-CM | POA: Diagnosis present

## 2019-03-16 LAB — SARS CORONAVIRUS 2 (TAT 6-24 HRS): SARS Coronavirus 2: NEGATIVE

## 2019-03-18 ENCOUNTER — Other Ambulatory Visit: Payer: Self-pay

## 2019-03-18 ENCOUNTER — Encounter (HOSPITAL_COMMUNITY)
Admission: RE | Admit: 2019-03-18 | Discharge: 2019-03-18 | Disposition: A | Discharge status: Home / Self Care | Payer: PRIVATE HEALTH INSURANCE | Source: Ambulatory Visit | Attending: Orthopedic Surgery | Admitting: Orthopedic Surgery

## 2019-03-18 DIAGNOSIS — M1612 Unilateral primary osteoarthritis, left hip: Secondary | ICD-10-CM | POA: Diagnosis not present

## 2019-03-18 DIAGNOSIS — Z01812 Encounter for preprocedural laboratory examination: Secondary | ICD-10-CM | POA: Diagnosis not present

## 2019-03-18 DIAGNOSIS — Z79899 Other long term (current) drug therapy: Secondary | ICD-10-CM | POA: Diagnosis not present

## 2019-03-18 DIAGNOSIS — I10 Essential (primary) hypertension: Secondary | ICD-10-CM | POA: Diagnosis not present

## 2019-03-18 DIAGNOSIS — Z01818 Encounter for other preprocedural examination: Secondary | ICD-10-CM | POA: Diagnosis present

## 2019-03-18 LAB — URINALYSIS, ROUTINE W REFLEX MICROSCOPIC
Bilirubin Urine: NEGATIVE
Glucose, UA: NEGATIVE mg/dL
Hgb urine dipstick: NEGATIVE
Ketones, ur: NEGATIVE mg/dL
Leukocytes,Ua: NEGATIVE
Nitrite: NEGATIVE
Protein, ur: NEGATIVE mg/dL
Specific Gravity, Urine: 1.011 (ref 1.005–1.030)
pH: 7 (ref 5.0–8.0)

## 2019-03-18 LAB — COMPREHENSIVE METABOLIC PANEL
ALT: 16 U/L (ref 0–44)
AST: 21 U/L (ref 15–41)
Albumin: 3.8 g/dL (ref 3.5–5.0)
Alkaline Phosphatase: 72 U/L (ref 38–126)
Anion gap: 6 (ref 5–15)
BUN: 13 mg/dL (ref 8–23)
CO2: 29 mmol/L (ref 22–32)
Calcium: 9.3 mg/dL (ref 8.9–10.3)
Chloride: 107 mmol/L (ref 98–111)
Creatinine, Ser: 1.08 mg/dL (ref 0.61–1.24)
GFR calc Af Amer: >60 mL/min (ref >60)
GFR calc non Af Amer: >60 mL/min (ref >60)
Glucose, Bld: 103 mg/dL — ABNORMAL HIGH (ref 70–99)
Potassium: 4 mmol/L (ref 3.5–5.1)
Sodium: 142 mmol/L (ref 135–145)
Total Bilirubin: 0.9 mg/dL (ref 0.3–1.2)
Total Protein: 7.3 g/dL (ref 6.5–8.1)

## 2019-03-18 LAB — PROTIME-INR
INR: 1 (ref 0.8–1.2)
Prothrombin Time: 13.2 seconds (ref 11.4–15.2)

## 2019-03-18 LAB — CBC
HCT: 43.7 % (ref 39.0–52.0)
Hemoglobin: 13.4 g/dL (ref 13.0–17.0)
MCH: 25.3 pg — ABNORMAL LOW (ref 26.0–34.0)
MCHC: 30.7 g/dL (ref 30.0–36.0)
MCV: 82.5 fL (ref 80.0–100.0)
Platelets: 173 10*3/uL (ref 150–400)
RBC: 5.3 MIL/uL (ref 4.22–5.81)
RDW: 14.2 % (ref 11.5–15.5)
WBC: 3.4 10*3/uL — ABNORMAL LOW (ref 4.0–10.5)
nRBC: 0 % (ref 0.0–0.2)

## 2019-03-18 LAB — SURGICAL PCR SCREEN
MRSA, PCR: NEGATIVE
Staphylococcus aureus: POSITIVE — AB

## 2019-03-18 LAB — ABO/RH: ABO/RH(D): B POS

## 2019-03-18 NOTE — Progress Notes (Signed)
PCR result routed to surgeon for review 

## 2019-03-19 NOTE — Progress Notes (Signed)
Anesthesia Chart Review   Case: [id number] Date/Time: 03/20/19 0715   Procedure: TOTAL HIP ARTHROPLASTY ANTERIOR APPROACH (Left Hip)   Anesthesia type: Spinal   Pre-op diagnosis: Degenerative joint disease left hip   Location: [facility] ROOM 05 / [facility]   Surgeons: Samson Frederic, MD      DISCUSSION:64 y.o. never smoker with h/o HTN, left hip DJD scheduled for above procedure 03/20/19 with Dr. Samson Frederic.   Pt seen by cardiologist, Dr. Adrian Blackwater, 03/01/2019 for preoperative evaluation.  Per OV note, "Stress test was normal with normal LVEF, Mild MR/AR/TR on echo.  Low risk for hip surgery.  Proceed with surgery."   Anticipate pt can proceed with planned procedure barring acute status change.   VS: BP 122/87   Pulse 69   Temp 37.1 C (Oral)   Resp 18   Ht 6\' 1"  (1.854 m)   Wt 85.2 kg   SpO2 100%   BMI 24.78 kg/m   PROVIDERS: Patient, No Pcp Per  , MD is cardiologist  LABS: Labs reviewed: Acceptable for surgery. (all labs ordered are listed, but only abnormal results are displayed)  Labs Reviewed  SURGICAL PCR SCREEN - Abnormal; Notable for the following components:      Result Value   Staphylococcus aureus POSITIVE (*)    All other components within normal limits  CBC - Abnormal; Notable for the following components:   WBC 3.4 (*)    MCH 25.3 (*)    All other components within normal limits  COMPREHENSIVE METABOLIC PANEL - Abnormal; Notable for the following components:   Glucose, Bld 103 (*)    All other components within normal limits  PROTIME-INR  URINALYSIS, ROUTINE W REFLEX MICROSCOPIC  TYPE AND SCREEN  ABO/RH     IMAGES:   EKG: 02/18/19 Sinus rhythm with 1st degree AV block  Moderate voltage criteria for LVH, may be normal variant  Borderline ECG   CV: Echo 03/01/2019 Assessment Technically adequate study Severely dilated left atrium, mildly dilated right atrium, aortic root, and ascending aorta with ventricles appearing normal in size.    Normal LV systolic function.  Normal RV systolic function  Normal left ventricular wall motion.   Normal right ventricular wall motion.,  Mild to moderate left ventricular hypertrophy with GRADE 1 (relaxation abnormalityastolic dysfunction. Right ventricular diastolic dysfunction.  Mild pulmonary regurgitation. Mild tricuspid regurgitation Mild pulmonary hypertension Mild mitral regurgitation Mild aortic regurgitation No pericardial effusion No subcostal view Intraatrial septal wall appears aneurysmal without evidence of shunting.  Past Medical History:  Diagnosis Date  . Arthritis   . Heart murmur    told initially but after cardiac tests-everything was fine  . Hypertension     Past Surgical History:  Procedure Laterality Date  . LEG SURGERY    . SHOULDER SURGERY    . TESTICLE SURGERY      MEDICATIONS: . lisinopril-hydrochlorothiazide (ZESTORETIC) 20-25 MG tablet  . tamsulosin (FLOMAX) 0.4 MG CAPS capsule  . traMADol (ULTRAM) 50 MG tablet   No current facility-administered medications for this encounter.   04/29/2019 [facility] Pre-Surgical Testing [phone] 03/19/19  9:18 AM

## 2019-03-19 NOTE — Anesthesia Preprocedure Evaluation (Addendum)
Anesthesia Evaluation  Patient identified by MRN, date of birth, ID band Patient awake    Reviewed: Allergy & Precautions, NPO status , Patient's Chart, lab work & pertinent test results  Airway Mallampati: I  TM Distance: >3 FB Neck ROM: Full    Dental no notable dental hx. (+) Missing, Dental Advisory Given,    Pulmonary neg pulmonary ROS,    Pulmonary exam normal breath sounds clear to auscultation       Cardiovascular hypertension, Pt. on medications Normal cardiovascular exam Rhythm:Regular Rate:Normal     Neuro/Psych negative neurological ROS  negative psych ROS   GI/Hepatic negative GI ROS, Neg liver ROS,   Endo/Other  negative endocrine ROS  Renal/GU negative Renal ROS  negative genitourinary   Musculoskeletal negative musculoskeletal ROS (+) Arthritis ,   Abdominal   Peds  Hematology negative hematology ROS (+)   Anesthesia Other Findings   Reproductive/Obstetrics                           Lab Results  Component Value Date   WBC 3.4 (L) 03/18/2019   HGB 13.4 03/18/2019   HCT 43.7 03/18/2019   MCV 82.5 03/18/2019   PLT 173 03/18/2019   Lab Results  Component Value Date   CREATININE 1.08 03/18/2019   BUN 13 03/18/2019   NA 142 03/18/2019   K 4.0 03/18/2019   CL 107 03/18/2019   CO2 29 03/18/2019    Anesthesia Physical Anesthesia Plan  ASA: II  Anesthesia Plan: Spinal   Post-op Pain Management:    Induction:   PONV Risk Score and Plan: 1 and Propofol infusion, Ondansetron and Treatment may vary due to age or medical condition  Airway Management Planned: Natural Airway and Simple Face Mask  Additional Equipment: None  Intra-op Plan:   Post-operative Plan:   Informed Consent:   Plan Discussed with:   Anesthesia Plan Comments:        Anesthesia Quick Evaluation

## 2019-03-20 ENCOUNTER — Ambulatory Visit (HOSPITAL_COMMUNITY): Payer: PRIVATE HEALTH INSURANCE

## 2019-03-20 ENCOUNTER — Ambulatory Visit (HOSPITAL_COMMUNITY)
Admission: RE | Admit: 2019-03-20 | Discharge: 2019-03-20 | Disposition: A | Discharge status: Home / Self Care | Payer: PRIVATE HEALTH INSURANCE | Attending: Orthopedic Surgery | Admitting: Orthopedic Surgery

## 2019-03-20 ENCOUNTER — Telehealth: Payer: Self-pay | Admitting: *Deleted

## 2019-03-20 ENCOUNTER — Encounter (HOSPITAL_COMMUNITY)
Admission: RE | Disposition: A | Discharge status: Home / Self Care | Payer: Self-pay | Source: Home / Self Care | Attending: Orthopedic Surgery

## 2019-03-20 ENCOUNTER — Ambulatory Visit (HOSPITAL_COMMUNITY): Payer: PRIVATE HEALTH INSURANCE | Admitting: Physician Assistant

## 2019-03-20 ENCOUNTER — Encounter (HOSPITAL_COMMUNITY): Payer: Self-pay | Admitting: Orthopedic Surgery

## 2019-03-20 ENCOUNTER — Ambulatory Visit (HOSPITAL_COMMUNITY): Payer: PRIVATE HEALTH INSURANCE | Admitting: Anesthesiology

## 2019-03-20 DIAGNOSIS — I110 Hypertensive heart disease with heart failure: Secondary | ICD-10-CM | POA: Insufficient documentation

## 2019-03-20 DIAGNOSIS — J449 Chronic obstructive pulmonary disease, unspecified: Secondary | ICD-10-CM | POA: Insufficient documentation

## 2019-03-20 DIAGNOSIS — D649 Anemia, unspecified: Secondary | ICD-10-CM | POA: Insufficient documentation

## 2019-03-20 DIAGNOSIS — I2510 Atherosclerotic heart disease of native coronary artery without angina pectoris: Secondary | ICD-10-CM | POA: Diagnosis not present

## 2019-03-20 DIAGNOSIS — M1612 Unilateral primary osteoarthritis, left hip: Secondary | ICD-10-CM | POA: Insufficient documentation

## 2019-03-20 DIAGNOSIS — E119 Type 2 diabetes mellitus without complications: Secondary | ICD-10-CM | POA: Insufficient documentation

## 2019-03-20 DIAGNOSIS — I509 Heart failure, unspecified: Secondary | ICD-10-CM | POA: Insufficient documentation

## 2019-03-20 DIAGNOSIS — Z09 Encounter for follow-up examination after completed treatment for conditions other than malignant neoplasm: Secondary | ICD-10-CM

## 2019-03-20 DIAGNOSIS — Z419 Encounter for procedure for purposes other than remedying health state, unspecified: Secondary | ICD-10-CM

## 2019-03-20 HISTORY — PX: TOTAL HIP ARTHROPLASTY: SHX124

## 2019-03-20 LAB — TYPE AND SCREEN
ABO/RH(D): B POS
Antibody Screen: NEGATIVE

## 2019-03-20 SURGERY — ARTHROPLASTY, HIP, TOTAL, ANTERIOR APPROACH
Anesthesia: Spinal | Site: Hip | Laterality: Left

## 2019-03-20 MED ORDER — ACETAMINOPHEN 10 MG/ML IV SOLN
1000.0000 mg | INTRAVENOUS | Status: AC
  Administered 2019-03-20: 1000 mg via INTRAVENOUS

## 2019-03-20 MED ORDER — TAMSULOSIN HCL 0.4 MG PO CAPS: 0.4000 mg | ORAL_CAPSULE | Freq: Every day | ORAL | Status: DC

## 2019-03-20 MED ORDER — BUPIVACAINE HCL (PF) 0.25 % IJ SOLN
INTRAMUSCULAR | Status: AC
  Filled 2019-03-20: qty 30

## 2019-03-20 MED ORDER — CEFAZOLIN SODIUM-DEXTROSE 2-4 GM/100ML-% IV SOLN
2.0000 g | INTRAVENOUS | Status: AC
  Administered 2019-03-20: 2 g via INTRAVENOUS

## 2019-03-20 MED ORDER — PROPOFOL 500 MG/50ML IV EMUL
INTRAVENOUS | Status: AC
  Filled 2019-03-20: qty 50

## 2019-03-20 MED ORDER — CELECOXIB 200 MG PO CAPS
ORAL_CAPSULE | ORAL | Status: AC
  Administered 2019-03-20: 200 mg via ORAL
  Filled 2019-03-20: qty 1

## 2019-03-20 MED ORDER — KETOROLAC TROMETHAMINE 30 MG/ML IJ SOLN
INTRAMUSCULAR | Status: DC | PRN
  Administered 2019-03-20: 30 mg

## 2019-03-20 MED ORDER — HYDROCODONE-ACETAMINOPHEN 7.5-325 MG PO TABS: 1.0000 | ORAL_TABLET | ORAL | 0 refills | Status: AC | PRN

## 2019-03-20 MED ORDER — ISOPROPYL ALCOHOL 70 % SOLN
Status: AC
  Filled 2019-03-20: qty 480

## 2019-03-20 MED ORDER — CHLORHEXIDINE GLUCONATE 4 % EX LIQD: 60.0000 mL | Freq: Once | CUTANEOUS | Status: DC

## 2019-03-20 MED ORDER — FENTANYL CITRATE (PF) 100 MCG/2ML IJ SOLN
INTRAMUSCULAR | Status: AC
  Administered 2019-03-20: 12:00:00 50 ug via INTRAVENOUS
  Filled 2019-03-20: qty 2

## 2019-03-20 MED ORDER — FENTANYL CITRATE (PF) 100 MCG/2ML IJ SOLN: 25.0000 ug | INTRAMUSCULAR | Status: DC | PRN

## 2019-03-20 MED ORDER — LACTATED RINGERS IV SOLN: INTRAVENOUS | Status: DC

## 2019-03-20 MED ORDER — SODIUM CHLORIDE (PF) 0.9 % IJ SOLN
INTRAMUSCULAR | Status: AC
  Filled 2019-03-20: qty 50

## 2019-03-20 MED ORDER — LACTATED RINGERS IV BOLUS
250.0000 mL | Freq: Once | INTRAVENOUS | Status: AC
  Administered 2019-03-20: 14:00:00 250 mL via INTRAVENOUS

## 2019-03-20 MED ORDER — PHENYLEPHRINE HCL-NACL 10-0.9 MG/250ML-% IV SOLN
INTRAVENOUS | Status: DC | PRN
  Administered 2019-03-20: 25 ug/min via INTRAVENOUS

## 2019-03-20 MED ORDER — BUPIVACAINE-EPINEPHRINE 0.25% -1:200000 IJ SOLN: INTRAMUSCULAR | Status: DC | PRN

## 2019-03-20 MED ORDER — ACETAMINOPHEN 10 MG/ML IV SOLN
INTRAVENOUS | Status: AC
  Filled 2019-03-20: qty 100

## 2019-03-20 MED ORDER — HYDROCODONE-ACETAMINOPHEN 7.5-325 MG PO TABS: 1.0000 | ORAL_TABLET | ORAL | 0 refills | Status: DC | PRN

## 2019-03-20 MED ORDER — LACTATED RINGERS IV BOLUS: 250.0000 mL | Freq: Once | INTRAVENOUS | Status: DC

## 2019-03-20 MED ORDER — WATER FOR IRRIGATION, STERILE IR SOLN
Status: DC | PRN
  Administered 2019-03-20: 1000 mL

## 2019-03-20 MED ORDER — LACTATED RINGERS IV BOLUS
500.0000 mL | Freq: Once | INTRAVENOUS | Status: AC
  Administered 2019-03-20: 11:00:00 500 mL via INTRAVENOUS

## 2019-03-20 MED ORDER — PROPOFOL 10 MG/ML IV BOLUS
INTRAVENOUS | Status: DC | PRN
  Administered 2019-03-20 (×2): 20 mg via INTRAVENOUS

## 2019-03-20 MED ORDER — METHOCARBAMOL 500 MG IVPB - SIMPLE MED
INTRAVENOUS | Status: AC
  Administered 2019-03-20: 12:00:00 500 mg via INTRAVENOUS
  Filled 2019-03-20: qty 50

## 2019-03-20 MED ORDER — ONDANSETRON HCL 4 MG PO TABS: 4.0000 mg | ORAL_TABLET | Freq: Three times a day (TID) | ORAL | 0 refills | Status: DC | PRN

## 2019-03-20 MED ORDER — FENTANYL CITRATE (PF) 100 MCG/2ML IJ SOLN
INTRAMUSCULAR | Status: DC | PRN
  Administered 2019-03-20: 50 ug via INTRAVENOUS

## 2019-03-20 MED ORDER — TRANEXAMIC ACID-NACL 1000-0.7 MG/100ML-% IV SOLN
INTRAVENOUS | Status: AC
  Filled 2019-03-20: qty 100

## 2019-03-20 MED ORDER — BUPIVACAINE HCL (PF) 0.25 % IJ SOLN
INTRAMUSCULAR | Status: DC | PRN
  Administered 2019-03-20: 30 mL

## 2019-03-20 MED ORDER — TRANEXAMIC ACID-NACL 1000-0.7 MG/100ML-% IV SOLN
1000.0000 mg | INTRAVENOUS | Status: AC
  Administered 2019-03-20: 1000 mg via INTRAVENOUS

## 2019-03-20 MED ORDER — PHENYLEPHRINE HCL (PRESSORS) 10 MG/ML IV SOLN
INTRAVENOUS | Status: AC
  Filled 2019-03-20: qty 1

## 2019-03-20 MED ORDER — CELECOXIB 200 MG PO CAPS: 200.0000 mg | ORAL_CAPSULE | Freq: Once | ORAL | Status: AC

## 2019-03-20 MED ORDER — CEFAZOLIN SODIUM-DEXTROSE 2-4 GM/100ML-% IV SOLN
INTRAVENOUS | Status: AC
  Filled 2019-03-20: qty 100

## 2019-03-20 MED ORDER — SODIUM CHLORIDE 0.9 % IV SOLN: INTRAVENOUS | Status: DC

## 2019-03-20 MED ORDER — ACETAMINOPHEN 500 MG PO TABS: 1000.0000 mg | ORAL_TABLET | Freq: Once | ORAL | Status: DC

## 2019-03-20 MED ORDER — EPHEDRINE 5 MG/ML INJ
INTRAVENOUS | Status: AC
  Filled 2019-03-20: qty 10

## 2019-03-20 MED ORDER — METHOCARBAMOL 500 MG IVPB - SIMPLE MED: 500.0000 mg | Freq: Four times a day (QID) | INTRAVENOUS | Status: DC | PRN

## 2019-03-20 MED ORDER — EPHEDRINE SULFATE-NACL 50-0.9 MG/10ML-% IV SOSY
PREFILLED_SYRINGE | INTRAVENOUS | Status: DC | PRN
  Administered 2019-03-20: 10 mg via INTRAVENOUS

## 2019-03-20 MED ORDER — KETOROLAC TROMETHAMINE 30 MG/ML IJ SOLN
INTRAMUSCULAR | Status: AC
  Filled 2019-03-20: qty 1

## 2019-03-20 MED ORDER — ALBUMIN HUMAN 5 % IV SOLN: INTRAVENOUS | Status: DC | PRN

## 2019-03-20 MED ORDER — ONDANSETRON HCL 4 MG/2ML IJ SOLN
INTRAMUSCULAR | Status: DC | PRN
  Administered 2019-03-20: 4 mg via INTRAVENOUS

## 2019-03-20 MED ORDER — SENNA 8.6 MG PO TABS: 2.0000 | ORAL_TABLET | Freq: Every day | ORAL | 1 refills | Status: AC

## 2019-03-20 MED ORDER — FENTANYL CITRATE (PF) 100 MCG/2ML IJ SOLN
INTRAMUSCULAR | Status: AC
  Filled 2019-03-20: qty 2

## 2019-03-20 MED ORDER — ALBUMIN HUMAN 5 % IV SOLN
INTRAVENOUS | Status: AC
  Filled 2019-03-20: qty 500

## 2019-03-20 MED ORDER — DOCUSATE SODIUM 100 MG PO CAPS: 100.0000 mg | ORAL_CAPSULE | Freq: Two times a day (BID) | ORAL | 1 refills | Status: AC

## 2019-03-20 MED ORDER — METHOCARBAMOL 500 MG PO TABS: 500.0000 mg | ORAL_TABLET | Freq: Four times a day (QID) | ORAL | Status: DC | PRN

## 2019-03-20 MED ORDER — PROPOFOL 500 MG/50ML IV EMUL
INTRAVENOUS | Status: DC | PRN
  Administered 2019-03-20: 75 ug/kg/min via INTRAVENOUS

## 2019-03-20 MED ORDER — MIDAZOLAM HCL 5 MG/5ML IJ SOLN
INTRAMUSCULAR | Status: DC | PRN
  Administered 2019-03-20: 2 mg via INTRAVENOUS

## 2019-03-20 MED ORDER — POVIDONE-IODINE 10 % EX SWAB: 2.0000 "application " | Freq: Once | CUTANEOUS | Status: DC

## 2019-03-20 MED ORDER — POVIDONE-IODINE 10 % EX SWAB
2.0000 "application " | Freq: Once | CUTANEOUS | Status: AC
  Administered 2019-03-20: 2 via TOPICAL

## 2019-03-20 MED ORDER — ASPIRIN 81 MG PO CHEW: 81.0000 mg | CHEWABLE_TABLET | Freq: Two times a day (BID) | ORAL | 0 refills | Status: AC

## 2019-03-20 MED ORDER — DEXAMETHASONE SODIUM PHOSPHATE 10 MG/ML IJ SOLN
INTRAMUSCULAR | Status: AC
  Filled 2019-03-20: qty 1

## 2019-03-20 MED ORDER — PROPOFOL 10 MG/ML IV BOLUS
INTRAVENOUS | Status: AC
  Filled 2019-03-20: qty 20

## 2019-03-20 MED ORDER — MIDAZOLAM HCL 2 MG/2ML IJ SOLN
INTRAMUSCULAR | Status: AC
  Filled 2019-03-20: qty 2

## 2019-03-20 MED ORDER — ONDANSETRON HCL 4 MG/2ML IJ SOLN
INTRAMUSCULAR | Status: AC
  Filled 2019-03-20: qty 2

## 2019-03-20 MED ORDER — SODIUM CHLORIDE (PF) 0.9 % IJ SOLN
INTRAMUSCULAR | Status: DC | PRN
  Administered 2019-03-20: 30 mL

## 2019-03-20 MED ORDER — SODIUM CHLORIDE 0.9 % IR SOLN
Status: DC | PRN
  Administered 2019-03-20: 3000 mL

## 2019-03-20 MED ORDER — PHENYLEPHRINE 40 MCG/ML (10ML) SYRINGE FOR IV PUSH (FOR BLOOD PRESSURE SUPPORT)
PREFILLED_SYRINGE | INTRAVENOUS | Status: AC
  Filled 2019-03-20: qty 10

## 2019-03-20 SURGICAL SUPPLY — 60 items
BAG DECANTER FOR FLEXI CONT (MISCELLANEOUS) IMPLANT
BAG ZIPLOCK 12X15 (MISCELLANEOUS) IMPLANT
BLADE SURG SZ10 CARB STEEL (BLADE) IMPLANT
CHLORAPREP W/TINT 26 (MISCELLANEOUS) ×3 IMPLANT
COVER PERINEAL POST (MISCELLANEOUS) ×3 IMPLANT
COVER SURGICAL LIGHT HANDLE (MISCELLANEOUS) ×3 IMPLANT
COVER WAND RF STERILE (DRAPES) IMPLANT
CUP ACET PINNACLE SECTR 60MM (Hips) IMPLANT
DECANTER SPIKE VIAL GLASS SM (MISCELLANEOUS) ×3 IMPLANT
DERMABOND ADVANCED (GAUZE/BANDAGES/DRESSINGS) ×4
DERMABOND ADVANCED .7 DNX12 (GAUZE/BANDAGES/DRESSINGS) ×2 IMPLANT
DRAPE IMP U-DRAPE 54X76 (DRAPES) ×3 IMPLANT
DRAPE SHEET LG 3/4 BI-LAMINATE (DRAPES) ×9 IMPLANT
DRAPE STERI IOBAN 125X83 (DRAPES) IMPLANT
DRAPE U-SHAPE 47X51 STRL (DRAPES) ×6 IMPLANT
DRESSING AQUACEL AG SP 3.5X10 (GAUZE/BANDAGES/DRESSINGS) IMPLANT
DRSG AQUACEL AG ADV 3.5X10 (GAUZE/BANDAGES/DRESSINGS) ×3 IMPLANT
DRSG AQUACEL AG SP 3.5X10 (GAUZE/BANDAGES/DRESSINGS) ×3
ELECT REM PT RETURN 15FT ADLT (MISCELLANEOUS) ×3 IMPLANT
GAUZE SPONGE 4X4 12PLY STRL (GAUZE/BANDAGES/DRESSINGS) ×3 IMPLANT
GLOVE BIO SURGEON STRL SZ8.5 (GLOVE) ×6 IMPLANT
GLOVE BIOGEL PI IND STRL 8.5 (GLOVE) ×1 IMPLANT
GLOVE BIOGEL PI INDICATOR 8.5 (GLOVE) ×2
GOWN SPEC L3 XXLG W/TWL (GOWN DISPOSABLE) ×3 IMPLANT
HANDPIECE INTERPULSE COAX TIP (DISPOSABLE) ×2
HEAD CERAMIC 36 PLUS5 (Hips) ×2 IMPLANT
HOLDER FOLEY CATH W/STRAP (MISCELLANEOUS) ×3 IMPLANT
HOOD PEEL AWAY FLYTE STAYCOOL (MISCELLANEOUS) ×12 IMPLANT
JET LAVAGE IRRISEPT WOUND (IRRIGATION / IRRIGATOR) ×3
KIT TURNOVER KIT A (KITS) IMPLANT
LAVAGE JET IRRISEPT WOUND (IRRIGATION / IRRIGATOR) ×1 IMPLANT
LINER NEUTRAL 58X36MM PLUS4 ×2 IMPLANT
MANIFOLD NEPTUNE II (INSTRUMENTS) ×3 IMPLANT
MARKER SKIN DUAL TIP RULER LAB (MISCELLANEOUS) ×3 IMPLANT
NDL SAFETY ECLIPSE 18X1.5 (NEEDLE) ×1 IMPLANT
NDL SPNL 18GX3.5 QUINCKE PK (NEEDLE) ×1 IMPLANT
NEEDLE HYPO 18GX1.5 SHARP (NEEDLE) ×2
NEEDLE SPNL 18GX3.5 QUINCKE PK (NEEDLE) ×3 IMPLANT
PACK ANTERIOR HIP CUSTOM (KITS) ×3 IMPLANT
PENCIL SMOKE EVACUATOR (MISCELLANEOUS) IMPLANT
PINNSECTOR W/GRIP ACE CUP 60MM (Hips) ×3 IMPLANT
SAW OSC TIP CART 19.5X105X1.3 (SAW) ×3 IMPLANT
SEALER BIPOLAR AQUA 6.0 (INSTRUMENTS) ×3 IMPLANT
SET HNDPC FAN SPRY TIP SCT (DISPOSABLE) ×1 IMPLANT
STEM TRI LOC BPS SZ6 W GRIPTON IMPLANT
SUT ETHIBOND NAB CT1 #1 30IN (SUTURE) ×6 IMPLANT
SUT MNCRL AB 3-0 PS2 18 (SUTURE) ×3 IMPLANT
SUT MNCRL AB 4-0 PS2 18 (SUTURE) ×3 IMPLANT
SUT MON AB 2-0 CT1 36 (SUTURE) ×6 IMPLANT
SUT STRATAFIX PDO 1 14 VIOLET (SUTURE) ×2
SUT STRATFX PDO 1 14 VIOLET (SUTURE) ×1
SUT VIC AB 2-0 CT1 27 (SUTURE) ×2
SUT VIC AB 2-0 CT1 TAPERPNT 27 (SUTURE) ×1 IMPLANT
SUTURE STRATFX PDO 1 14 VIOLET (SUTURE) ×1 IMPLANT
SYR 3ML LL SCALE MARK (SYRINGE) ×3 IMPLANT
TRAY FOLEY MTR SLVR 16FR STAT (SET/KITS/TRAYS/PACK) IMPLANT
TRI LOC BPS SZ 6 W GRIPTON ×3 IMPLANT
WATER STERILE IRR 1000ML POUR (IV SOLUTION) ×3 IMPLANT
YANKAUER SUCT BULB TIP 10FT TU (MISCELLANEOUS) ×3 IMPLANT
YANKAUER SUCT BULB TIP NO VENT (SUCTIONS) ×2 IMPLANT

## 2019-03-20 NOTE — Transfer of Care (Signed)
Immediate Anesthesia Transfer of Care Note  Patient: Lee Jenkins  Procedure(s) Performed: TOTAL HIP ARTHROPLASTY ANTERIOR APPROACH (Left Hip)  Patient Location: PACU  Anesthesia Type:Spinal  Level of Consciousness: awake, alert , oriented and patient cooperative  Airway & Oxygen Therapy: Patient Spontanous Breathing and Patient connected to face mask oxygen  Post-op Assessment: Report given to RN and Post -op Vital signs reviewed and stable  Post vital signs: Reviewed and stable  Last Vitals:  Vitals Value Taken Time  BP 111/74 03/20/19 1057  Temp    Pulse 75 03/20/19 1059  Resp 19 03/20/19 1059  SpO2 100 % 03/20/19 1059  Vitals shown include unvalidated device data.  Last Pain:  Vitals:   03/20/19 0533  TempSrc: Oral         Complications: No apparent anesthesia complications

## 2019-03-20 NOTE — Anesthesia Procedure Notes (Signed)
Spinal  Patient location during procedure: OR Start time: 03/20/2019 7:45 AM End time: 03/20/2019 8:00 AM Staffing Performed: anesthesiologist  Anesthesiologist: Elmer Picker, MD Preanesthetic Checklist Completed: patient identified, IV checked, risks and benefits discussed, surgical consent, monitors and equipment checked, pre-op evaluation and timeout performed Spinal Block Patient position: sitting Prep: DuraPrep and site prepped and draped Patient monitoring: cardiac monitor, continuous pulse ox and blood pressure Approach: midline Location: L3-4 Injection technique: single-shot Needle Needle type: Pencan  Needle gauge: 24 G Needle length: 9 cm Assessment Sensory level: T6 Additional Notes Functioning IV was confirmed and monitors were applied. Sterile prep and drape, including hand hygiene and sterile gloves were used. The patient was positioned and the spine was prepped. The skin was anesthetized with lidocaine.  Free flow of clear CSF was obtained prior to injecting local anesthetic into the CSF.  The spinal needle aspirated freely following injection.  The needle was carefully withdrawn.  The patient tolerated the procedure well.

## 2019-03-20 NOTE — Op Note (Signed)
OPERATIVE REPORT  SURGEON: Rod Can, MD   ASSISTANT: Nehemiah Massed, PA-C.  PREOPERATIVE DIAGNOSIS: Left hip arthritis.   POSTOPERATIVE DIAGNOSIS: Left hip arthritis.   PROCEDURE: Left total hip arthroplasty, anterior approach.   IMPLANTS: DePuy Tri Lock stem, size 6, hi offset. DePuy Pinnacle Cup, size 60 mm. DePuy Altrx liner, size 36 by 60 mm, +4 neutral. DePuy Biolox ceramic head ball, size 36 + 5 mm.  ANESTHESIA:  MAC, Regional and Spinal  ESTIMATED BLOOD LOSS:-550 mL    ANTIBIOTICS: 550 mL.  DRAINS: None.  COMPLICATIONS: None.   CONDITION: PACU - hemodynamically stable.   BRIEF CLINICAL NOTE: Lee Jenkins is a 64 y.o. male with a long-standing history of Left hip arthritis. After failing conservative management, the patient was indicated for total hip arthroplasty. The risks, benefits, and alternatives to the procedure were explained, and the patient elected to proceed.  PROCEDURE IN DETAIL: Surgical site was marked by myself in the pre-op holding area. Once inside the operating room, spinal anesthesia was obtained, and a foley catheter was inserted. The patient was then positioned on the Hana table.  All bony prominences were well padded.  The hip was prepped and draped in the normal sterile surgical fashion.  A time-out was called verifying side and site of surgery. The patient received IV antibiotics within 60 minutes of beginning the procedure.   The direct anterior approach to the hip was performed through the Hueter interval.  Lateral femoral circumflex vessels were treated with the Auqumantys. The anterior capsule was exposed and an inverted T capsulotomy was made. The femoral neck cut was made to the level of the templated cut.  A corkscrew was placed into the head and the head was removed.  The femoral head was found to have eburnated bone. The head was passed to the back table and was measured.   Acetabular exposure was achieved, and the pulvinar and  labrum were excised. Sequential reaming of the acetabulum was then performed up to a size 59 mm reamer. A 60 mm cup was then opened and impacted into place at approximately 40 degrees of abduction and 20 degrees of anteversion. The final polyethylene liner was impacted into place and acetabular osteophytes were removed.    I then gained femoral exposure taking care to protect the abductors and greater trochanter.  This was performed using standard external rotation, extension, and adduction.  The capsule was peeled off the inner aspect of the greater trochanter, taking care to preserve the short external rotators. A cookie cutter was used to enter the femoral canal, and then the femoral canal finder was placed.  Sequential broaching was performed up to a size 6.  Calcar planer was used on the femoral neck remnant.  I placed a hi offset neck and a trial head ball.  The hip was reduced.  Leg lengths and offset were checked fluoroscopically.  The hip was dislocated and trial components were removed.  The final implants were placed, and the hip was reduced.  Fluoroscopy was used to confirm component position and leg lengths.  At 90 degrees of external rotation and full extension, the hip was stable to an anterior directed force.   The wound was copiously irrigated with Irrisept solution and normal saline using pule lavage.  Marcaine solution was injected into the periarticular soft tissue.  The wound was closed in layers using #1 Stratafix for the fascia, 2-0 Vicryl for the subcutaneous fat, 2-0 Monocryl for the deep dermal layer, 3-0 running Monocryl subcuticular  stitch, and Dermabond for the skin.  Once the glue was fully dried, an Aquacell Ag dressing was applied.  The patient was transported to the recovery room in stable condition.  Sponge, needle, and instrument counts were correct at the end of the case x2.  The patient tolerated the procedure well and there were no known complications.  Please note that a  surgical assistant was a medical necessity for this procedure to perform it in a safe and expeditious manner. Assistant was necessary to provide appropriate retraction of vital neurovascular structures, to prevent femoral fracture, and to allow for anatomic placement of the prosthesis.

## 2019-03-20 NOTE — Telephone Encounter (Signed)
-----   Message from Jerilee Field, MD sent at 03/19/2019 10:34 PM EST ----- Let Lee Jenkins know he had some blood in his urine when I saw him last week. He should continue tamsulosin and very important to follow-up as planned so that we can recheck the urine. Blood in the urine can be a sign of cancer but that is rare. We just need to recheck the urine.  ----- Message ----- From: Levada Schilling, CMA Sent: 03/18/2019   7:29 AM EST To: Jerilee Field, MD   ----- Message ----- From: Interface, [facility] Lab Results In Sent: 03/15/2019   4:37 PM EST To: [name] Clinical

## 2019-03-20 NOTE — Discharge Summary (Signed)
Physician Discharge Summary  Patient ID: Lee Jenkins MRN: [id number] DOB/AGE: 1955-03-18 64 y.o.  Admit date: 03/20/2019 Discharge date: 03/20/2019  Admission Diagnoses:  Osteoarthritis of left hip  Discharge Diagnoses:  Principal Problem:   Osteoarthritis of left hip   Past Medical History:  Diagnosis Date  . Arthritis   . Heart murmur    told initially but after cardiac tests-everything was fine  . Hypertension     Surgeries: Procedure(s): TOTAL HIP ARTHROPLASTY ANTERIOR APPROACH on 03/20/2019   Consultants (if any):   Discharged Condition: Improved  Hospital Course: Lee Jenkins is an 64 y.o. male who was admitted 03/20/2019 with a diagnosis of Osteoarthritis of left hip and went to the operating room on 03/20/2019 and underwent the above named procedures.    He was given perioperative antibiotics:  Anti-infectives (From admission, onward)   Start     Dose/Rate Route Frequency Ordered Stop   03/20/19 0600  ceFAZolin (ANCEF) IVPB 2g/100 mL premix     2 g 200 mL/hr over 30 Minutes Intravenous On call to O.R. 03/20/19 0518 03/20/19 0752   03/20/19 0529  ceFAZolin (ANCEF) 2-4 GM/100ML-% IVPB    Note to Pharmacy: Viviano Simas   : cabinet override      03/20/19 0529 03/20/19 1744    .  He was given sequential compression devices, early ambulation, and ASA for DVT prophylaxis.  He benefited maximally from the hospital stay and there were no complications.    Recent vital signs:  Vitals:   03/20/19 0533  BP: 140/90  Pulse: 76  Resp: 15  Temp: 99.3 F (37.4 C)  SpO2: 100%    Recent laboratory studies:  Lab Results  Component Value Date   HGB 13.4 03/18/2019   Lab Results  Component Value Date   WBC 3.4 (L) 03/18/2019   PLT 173 03/18/2019   Lab Results  Component Value Date   INR 1.0 03/18/2019   Lab Results  Component Value Date   NA 142 03/18/2019   K 4.0 03/18/2019   CL 107 03/18/2019   CO2 29 03/18/2019   BUN 13 03/18/2019   CREATININE  1.08 03/18/2019   GLUCOSE 103 (H) 03/18/2019    Discharge Medications:   Allergies as of 03/20/2019   No Known Allergies     Medication List    STOP taking these medications   traMADol 50 MG tablet Commonly known as: ULTRAM     TAKE these medications   aspirin 81 MG chewable tablet Commonly known as: Aspirin Childrens Chew 1 tablet (81 mg total) by mouth 2 (two) times daily with a meal.   docusate sodium 100 MG capsule Commonly known as: Colace Take 1 capsule (100 mg total) by mouth 2 (two) times daily.   HYDROcodone-acetaminophen 7.5-325 MG tablet Commonly known as: Norco Take 1 tablet by mouth every 4 (four) hours as needed for moderate pain.   lisinopril-hydrochlorothiazide 20-25 MG tablet Commonly known as: ZESTORETIC Take 1 tablet by mouth daily before breakfast.   ondansetron 4 MG tablet Commonly known as: Zofran Take 1 tablet (4 mg total) by mouth every 8 (eight) hours as needed for nausea or vomiting.   senna 8.6 MG Tabs tablet Commonly known as: SENOKOT Take 2 tablets (17.2 mg total) by mouth at bedtime.   tamsulosin 0.4 MG Caps capsule Commonly known as: FLOMAX Take 1 capsule (0.4 mg total) by mouth daily after supper.       Diagnostic Studies: No results found.  Disposition: Discharge disposition: 01-Home or  Self Care       Discharge Instructions    Call MD / Call 911   Complete by: As directed    If you experience chest pain or shortness of breath, CALL 911 and be transported to the hospital emergency room.  If you develope a fever above 101 F, pus (white drainage) or increased drainage or redness at the wound, or calf pain, call your surgeon's office.   Constipation Prevention   Complete by: As directed    Drink plenty of fluids.  Prune juice may be helpful.  You may use a stool softener, such as Colace (over the counter) 100 mg twice a day.  Use MiraLax (over the counter) for constipation as needed.   Diet - low sodium heart healthy    Complete by: As directed    Driving restrictions   Complete by: As directed    No driving for 6 weeks   Increase activity slowly as tolerated   Complete by: As directed    Lifting restrictions   Complete by: As directed    No lifting for 6 weeks   TED hose   Complete by: As directed    Use stockings (TED hose) for 2 weeks on both leg(s).  You may remove them at night for sleeping.      Follow-up Information    Masayo Fera, Aaron Edelman, MD. Schedule an appointment as soon as possible for a visit in 2 weeks.   Specialty: Orthopedic Surgery Why: For wound re-check Contact information: [address] [place] [address] [phone]            Signed: Hilton Cork Rylynn Schoneman 03/20/2019, 10:40 AM

## 2019-03-20 NOTE — Evaluation (Signed)
Physical Therapy Evaluation Patient Details Name: Lee Jenkins MRN: [id number] DOB: 1955/10/23 Today's Date: 03/20/2019   History of Present Illness  s/p l EFT DIRCT ANTERIOR tha  Clinical Impression  The patient reports still slight numbness right glut but able to ambulate and practice steps safely. HEP provided and reviewed with patient. Plans Dc today. Has a RW. No further PT unless DC not occurring.    Follow Up Recommendations No PT follow up    Equipment Recommendations  Rolling walker with 5" wheels    Recommendations for Other Services       Precautions / Restrictions Precautions Precautions: Fall      Mobility  Bed Mobility Overal bed mobility: Needs Assistance Bed Mobility: Supine to Sit     Supine to sit: Supervision     General bed mobility comments: cues for technique  Transfers Overall transfer level: Needs assistance Equipment used: Rolling walker (2 wheeled) Transfers: Sit to/from Stand Sit to Stand: Min guard         General transfer comment: cues for hand and  left leg position  Ambulation/Gait Ambulation/Gait assistance: Min guard;Min assist Gait Distance (Feet): 200 Feet Assistive device: Rolling walker (2 wheeled) Gait Pattern/deviations: Step-through pattern     General Gait Details: cues for sequence  Stairs Stairs: Yes Stairs assistance: Min guard Stair Management: Two rails;Step to pattern Number of Stairs: 4 General stair comments: cues for sequence  Wheelchair Mobility    Modified Rankin (Stroke Patients Only)       Balance Overall balance assessment: No apparent balance deficits (not formally assessed)                                           Pertinent Vitals/Pain Pain Assessment: 0-10 Pain Score: 2  Pain Descriptors / Indicators: Discomfort;Tightness Pain Intervention(s): Monitored during session;Premedicated before session    Home Living Family/patient expects to be discharged to::  Private residence Living Arrangements: Spouse/significant other Available Help at Discharge: Family Type of Home: House Home Access: Stairs to enter Entrance Stairs-Rails: Right;Left;Can reach both Secretary/administrator of Steps: 4 Home Layout: One level Home Equipment: None      Prior Function Level of Independence: Independent               Hand Dominance        Extremity/Trunk Assessment   Upper Extremity Assessment Upper Extremity Assessment: Overall WFL for tasks assessed    Lower Extremity Assessment Lower Extremity Assessment: LLE deficits/detail LLE Deficits / Details: aqble to flex hip in supine, extensd knee    Cervical / Trunk Assessment Cervical / Trunk Assessment: Normal  Communication   Communication: No difficulties  Cognition Arousal/Alertness: Awake/alert Behavior During Therapy: WFL for tasks assessed/performed Overall Cognitive Status: Within Functional Limits for tasks assessed                                        General Comments      Exercises Total Joint Exercises Ankle Circles/Pumps: AROM;Both;10 reps Quad Sets: AROM;Both;10 reps Short Arc Quad: AROM;Left;10 reps Heel Slides: AROM;Left;10 reps Hip ABduction/ADduction: AROM;Left;10 reps Long Arc Quad: AROM;Left;10 reps   Assessment/Plan    PT Assessment Patent does not need any further PT services  PT Problem List Decreased strength;Decreased mobility;Decreased safety awareness;Decreased range of motion;Decreased knowledge of precautions;Decreased  activity tolerance;Decreased knowledge of use of DME       PT Treatment Interventions      PT Goals (Current goals can be found in the Care Plan section)  Acute Rehab PT Goals Patient Stated Goal: go home PT Goal Formulation: All assessment and education complete, DC therapy    Frequency     Barriers to discharge        Co-evaluation               AM-PAC PT "6 Clicks" Mobility  Outcome Measure  Help needed turning from your back to your side while in a flat bed without using bedrails?: None Help needed moving from lying on your back to sitting on the side of a flat bed without using bedrails?: None Help needed moving to and from a bed to a chair (including a wheelchair)?: A Little Help needed standing up from a chair using your arms (e.g., wheelchair or bedside chair)?: A Little Help needed to walk in hospital room?: A Little Help needed climbing 3-5 steps with a railing? : A Little 6 Click Score: 20    End of Session Equipment Utilized During Treatment: Gait belt Activity Tolerance: Patient tolerated treatment well Patient left: in chair;with call bell/phone within reach Nurse Communication: Mobility status PT Visit Diagnosis: Muscle weakness (generalized) (M62.81)    Time: 7989-2119 PT Time Calculation (min) (ACUTE ONLY): 61 min   Charges:   PT Evaluation $PT Eval Low Complexity: 1 Low PT Treatments $Gait Training: 8-22 mins $Therapeutic Exercise: 8-22 mins $Self Care/Home Management: Lee Jenkins Pager [phone] Office [phone]   Claretha Cooper 03/20/2019, 4:24 PM

## 2019-03-20 NOTE — Interval H&P Note (Signed)
History and Physical Interval Note:  03/20/2019 7:46 AM  Lee Jenkins  has presented today for surgery, with the diagnosis of Degenerative joint disease left hip.  The various methods of treatment have been discussed with the patient and family. After consideration of risks, benefits and other options for treatment, the patient has consented to  Procedure(s): TOTAL HIP ARTHROPLASTY ANTERIOR APPROACH (Left) as a surgical intervention.  The patient's history has been reviewed, patient examined, no change in status, stable for surgery.  I have reviewed the patient's chart and labs.  Questions were answered to the patient's satisfaction.     Iline Oven Faustine Tates

## 2019-03-20 NOTE — Anesthesia Postprocedure Evaluation (Signed)
Anesthesia Post Note  Patient: Silviano Neuser  Procedure(s) Performed: TOTAL HIP ARTHROPLASTY ANTERIOR APPROACH (Left Hip)     Patient location during evaluation: PACU Anesthesia Type: Spinal Level of consciousness: oriented and awake and alert Pain management: pain level controlled Vital Signs Assessment: post-procedure vital signs reviewed and stable Respiratory status: spontaneous breathing, respiratory function stable and patient connected to nasal cannula oxygen Cardiovascular status: blood pressure returned to baseline and stable Postop Assessment: no headache, no backache and no apparent nausea or vomiting Anesthetic complications: no    Last Vitals:  Vitals:   03/20/19 1400 03/20/19 1500  BP: 112/81 118/90  Pulse: 76 89  Resp: 16 16  Temp:    SpO2: 100% 100%    Last Pain:  Vitals:   03/20/19 1500  TempSrc:   PainSc: 0-No pain                 Emmeline Winebarger L Bernette Seeman

## 2019-03-20 NOTE — Discharge Instructions (Signed)
°Dr. Cimberly Stoffel °Joint Replacement Specialist °[facility] °[address] °[place], [place] [address] °[phone] ° ° °TOTAL HIP REPLACEMENT POSTOPERATIVE DIRECTIONS ° ° ° °Hip Rehabilitation, Guidelines Following Surgery  ° °WEIGHT BEARING °Weight bearing as tolerated with assist device (walker, cane, etc) as directed, use it as long as suggested by your surgeon or therapist, typically at least 4-6 weeks. ° °The results of a hip operation are greatly improved after range of motion and muscle strengthening exercises. Follow all safety measures which are given to protect your hip. If any of these exercises cause increased pain or swelling in your joint, decrease the amount until you are comfortable again. Then slowly increase the exercises. Call your caregiver if you have problems or questions.  ° °HOME CARE INSTRUCTIONS  °Most of the following instructions are designed to prevent the dislocation of your new hip.  °Remove items at home which could result in a fall. This includes throw rugs or furniture in walking pathways.  °Continue medications as instructed at time of discharge. °· You may have some home medications which will be placed on hold until you complete the course of blood thinner medication. °· You may start showering once you are discharged home. Do not remove your dressing. °Do not put on socks or shoes without following the instructions of your caregivers.   °Sit on chairs with arms. Use the chair arms to help push yourself up when arising.  °Arrange for the use of a toilet seat elevator so you are not sitting low.  °· Walk with walker as instructed.  °You may resume a sexual relationship in one month or when given the OK by your caregiver.  °Use walker as long as suggested by your caregivers.  °You may put full weight on your legs and walk as much as is comfortable. °Avoid periods of inactivity such as sitting longer than an hour when not asleep. This helps prevent  blood clots.  °You may return to work once you are cleared by your surgeon.  °Do not drive a car for 6 weeks or until released by your surgeon.  °Do not drive while taking narcotics.  °Wear elastic stockings for two weeks following surgery during the day but you may remove then at night.  °Make sure you keep all of your appointments after your operation with all of your doctors and caregivers. You should call the office at the above phone number and make an appointment for approximately two weeks after the date of your surgery. °Please pick up a stool softener and laxative for home use as long as you are requiring pain medications. °· ICE to the affected hip every three hours for 30 minutes at a time and then as needed for pain and swelling. Continue to use ice on the hip for pain and swelling from surgery. You may notice swelling that will progress down to the foot and ankle.  This is normal after surgery.  Elevate the leg when you are not up walking on it.   °It is important for you to complete the blood thinner medication as prescribed by your doctor. °· Continue to use the breathing machine which will help keep your temperature down.  It is common for your temperature to cycle up and down following surgery, especially at night when you are not up moving around and exerting yourself.  The breathing machine keeps your lungs expanded and your temperature down. ° °RANGE OF MOTION AND STRENGTHENING EXERCISES  °These exercises are   designed to help you keep full movement of your hip joint. Follow your caregiver's or physical therapist's instructions. Perform all exercises about fifteen times, three times per day or as directed. Exercise both hips, even if you have had only one joint replacement. These exercises can be done on a training (exercise) mat, on the floor, on a table or on a bed. Use whatever works the best and is most comfortable for you. Use music or television while you are exercising so that the exercises  are a pleasant break in your day. This will make your life better with the exercises acting as a break in routine you can look forward to.  °Lying on your back, slowly slide your foot toward your buttocks, raising your knee up off the floor. Then slowly slide your foot back down until your leg is straight again.  °Lying on your back spread your legs as far apart as you can without causing discomfort.  °Lying on your side, raise your upper leg and foot straight up from the floor as far as is comfortable. Slowly lower the leg and repeat.  °Lying on your back, tighten up the muscle in the front of your thigh (quadriceps muscles). You can do this by keeping your leg straight and trying to raise your heel off the floor. This helps strengthen the largest muscle supporting your knee.  °Lying on your back, tighten up the muscles of your buttocks both with the legs straight and with the knee bent at a comfortable angle while keeping your heel on the floor.  ° °SKILLED REHAB INSTRUCTIONS: °If the patient is transferred to a skilled rehab facility following release from the hospital, a list of the current medications will be sent to the facility for the patient to continue.  When discharged from the skilled rehab facility, please have the facility set up the patient's Home Health Physical Therapy prior to being released. Also, the skilled facility will be responsible for providing the patient with their medications at time of release from the facility to include their pain medication and their blood thinner medication. If the patient is still at the rehab facility at time of the two week follow up appointment, the skilled rehab facility will also need to assist the patient in arranging follow up appointment in our office and any transportation needs. ° °MAKE SURE YOU:  °Understand these instructions.  °Will watch your condition.  °Will get help right away if you are not doing well or get worse. ° °Pick up stool softner and  laxative for home use following surgery while on pain medications. °Do not remove your dressing. °The dressing is waterproof--it is OK to take showers. °Continue to use ice for pain and swelling after surgery. °Do not use any lotions or creams on the incision until instructed by your surgeon. °Total Hip Protocol. ° ° °

## 2019-03-21 ENCOUNTER — Encounter: Payer: Self-pay | Admitting: *Deleted

## 2019-03-22 NOTE — Telephone Encounter (Signed)
No answer today .  

## 2019-05-10 ENCOUNTER — Encounter: Payer: Self-pay | Admitting: Urology

## 2019-05-10 ENCOUNTER — Ambulatory Visit: Payer: Self-pay | Admitting: Urology

## 2019-06-07 ENCOUNTER — Other Ambulatory Visit: Payer: Self-pay

## 2019-06-07 ENCOUNTER — Ambulatory Visit (INDEPENDENT_AMBULATORY_CARE_PROVIDER_SITE_OTHER): Payer: No Typology Code available for payment source | Admitting: Urology

## 2019-06-07 ENCOUNTER — Encounter: Payer: Self-pay | Admitting: Urology

## 2019-06-07 VITALS — BP 144/90 | HR 85 | Ht 73.0 in | Wt 201.0 lb

## 2019-06-07 DIAGNOSIS — R3912 Poor urinary stream: Secondary | ICD-10-CM

## 2019-06-07 DIAGNOSIS — N401 Enlarged prostate with lower urinary tract symptoms: Secondary | ICD-10-CM

## 2019-06-07 DIAGNOSIS — N138 Other obstructive and reflux uropathy: Secondary | ICD-10-CM

## 2019-06-07 MED ORDER — TAMSULOSIN HCL 0.4 MG PO CAPS: 0.4000 mg | ORAL_CAPSULE | Freq: Every day | ORAL | 11 refills | Status: DC

## 2019-06-07 NOTE — Progress Notes (Signed)
06/07/2019 2:31 PM   Lee Jenkins 06-Oct-1955 [id number]  Referring provider: No referring provider defined for this encounter.  No chief complaint on file.   HPI:  F/u - BPH with LUTS - Feb 2021. Frequency. Symptoms worse last 3 months as his left hip pain worsened. Started tamsulosin. Hip replaced Feb 2021. He has urgency especially with standing and turn key. Stream is intermittent - sometimes good, sometimes slow. He drinks water. No constipation. No NG risk.   PSA was 2.3 on 02/14/2019.  PVR 22 ml.   Today, continues tamsulosin. Doing much better. No gross hematuria.   PMH: Past Medical History:  Diagnosis Date  . Arthritis   . Heart murmur    told initially but after cardiac tests-everything was fine  . Hypertension     Surgical History: Past Surgical History:  Procedure Laterality Date  . LEG SURGERY    . SHOULDER SURGERY    . TESTICLE SURGERY    . TOTAL HIP ARTHROPLASTY Left 03/20/2019   Procedure: TOTAL HIP ARTHROPLASTY ANTERIOR APPROACH;  Surgeon: Rod Can, MD;  Location: [facility];  Service: Orthopedics;  Laterality: Left;    Home Medications:  Allergies as of 06/07/2019   No Known Allergies     Medication List       Accurate as of Jun 07, 2019  2:31 PM. If you have any questions, ask your nurse or doctor.        lisinopril-hydrochlorothiazide 20-25 MG tablet Commonly known as: ZESTORETIC Take 1 tablet by mouth daily before breakfast.   ondansetron 4 MG tablet Commonly known as: Zofran Take 1 tablet (4 mg total) by mouth every 8 (eight) hours as needed for nausea or vomiting.   tamsulosin 0.4 MG Caps capsule Commonly known as: FLOMAX Take 1 capsule (0.4 mg total) by mouth daily after supper.       Allergies: No Known Allergies  Family History: No family history on file.  Social History:  reports that he has never smoked. He has never used smokeless tobacco. He reports that he does not drink alcohol or use drugs.   Physical  Exam: There were no vitals taken for this visit.  Constitutional:  Alert and oriented, No acute distress. HEENT: [place] AT, moist mucus membranes.  Trachea midline, no masses. Cardiovascular: No clubbing, cyanosis, or edema. Respiratory: Normal respiratory effort, no increased work of breathing. GI: Abdomen is soft, nontender, nondistended, no abdominal masses GU: No CVA tenderness Skin: No rashes, bruises or suspicious lesions. Neurologic: Grossly intact, no focal deficits, moving all 4 extremities. Psychiatric: Normal mood and affect.  Laboratory Data: Lab Results  Component Value Date   WBC 3.4 (L) 03/18/2019   HGB 13.4 03/18/2019   HCT 43.7 03/18/2019   MCV 82.5 03/18/2019   PLT 173 03/18/2019    Lab Results  Component Value Date   CREATININE 1.08 03/18/2019    No results found for: PSA  No results found for: TESTOSTERONE  No results found for: HGBA1C  Urinalysis    Component Value Date/Time   COLORURINE YELLOW 03/18/2019 0810   APPEARANCEUR CLEAR 03/18/2019 0810   APPEARANCEUR Clear 03/15/2019 1423   LABSPEC 1.011 03/18/2019 0810   PHURINE 7.0 03/18/2019 0810   GLUCOSEU NEGATIVE 03/18/2019 0810   HGBUR NEGATIVE 03/18/2019 0810   BILIRUBINUR NEGATIVE 03/18/2019 0810   BILIRUBINUR Negative 03/15/2019 [place] 03/18/2019 0810   PROTEINUR NEGATIVE 03/18/2019 0810   NITRITE NEGATIVE 03/18/2019 0810   LEUKOCYTESUR NEGATIVE 03/18/2019 0810  Lab Results  Component Value Date   LABMICR See below: 03/15/2019   WBCUA 0-5 03/15/2019   LABEPIT None seen 03/15/2019   BACTERIA Few 03/15/2019    Pertinent Imaging: N/a  No results found for this or any previous visit. No results found for this or any previous visit. No results found for this or any previous visit. No results found for this or any previous visit. No results found for this or any previous visit. No results found for this or any previous visit. No results found for this or any  previous visit. No results found for this or any previous visit.  Assessment & Plan:    BPH with LUTS  - doing well. Continue tamsulosin.   No follow-ups on file.  Jerilee Field, MD  [facility] [address], [address] [place], [place] [address] [phone]

## 2019-06-07 NOTE — Patient Instructions (Signed)

## 2019-06-08 LAB — URINALYSIS, COMPLETE
Bilirubin, UA: NEGATIVE
Glucose, UA: NEGATIVE
Ketones, UA: NEGATIVE
Leukocytes,UA: NEGATIVE
Nitrite, UA: NEGATIVE
Protein,UA: NEGATIVE
Specific Gravity, UA: >1.03 — ABNORMAL HIGH (ref 1.005–1.030)
Urobilinogen, Ur: 0.2 mg/dL (ref 0.2–1.0)
pH, UA: 5.5 (ref 5.0–7.5)

## 2019-06-08 LAB — MICROSCOPIC EXAMINATION
Bacteria, UA: NONE SEEN
Epithelial Cells (non renal): NONE SEEN /hpf (ref 0–10)
WBC, UA: NONE SEEN /hpf (ref 0–5)

## 2020-07-03 ENCOUNTER — Ambulatory Visit: Payer: Self-pay | Admitting: Urology

## 2020-07-06 ENCOUNTER — Ambulatory Visit: Payer: Self-pay | Admitting: Urology

## 2020-12-31 ENCOUNTER — Encounter: Payer: Self-pay | Admitting: *Deleted

## 2021-01-28 ENCOUNTER — Other Ambulatory Visit: Payer: Self-pay

## 2021-01-28 ENCOUNTER — Encounter: Payer: Self-pay | Admitting: Urology

## 2021-01-28 ENCOUNTER — Ambulatory Visit (INDEPENDENT_AMBULATORY_CARE_PROVIDER_SITE_OTHER): Payer: BC Managed Care – PPO | Admitting: Urology

## 2021-01-28 VITALS — BP 120/76 | HR 92 | Ht 73.0 in | Wt 190.0 lb

## 2021-01-28 DIAGNOSIS — R3129 Other microscopic hematuria: Secondary | ICD-10-CM | POA: Diagnosis not present

## 2021-01-28 DIAGNOSIS — R310 Gross hematuria: Secondary | ICD-10-CM

## 2021-01-28 NOTE — Progress Notes (Signed)
01/28/2021 2:58 PM   Lee Jenkins 1955/04/10 [id number]  Referring provider: Perrin Maltese, MD [place],  [place] [address]  Chief Complaint  Patient presents with   Hematuria    HPI: Lee Jenkins is a 66 y.o. male referred for evaluation of hematuria.  Recent DOT physical + hematuria UA report not available for review Record review with UA 02/2019 showing 3-10 RBC Denies gross hematuria No bothersome LUTS  Denies dysuria or recurrent UTI No flank, abdominal or pelvic pain No previous tobacco history    PMH: Past Medical History:  Diagnosis Date   Arthritis    Heart murmur    told initially but after cardiac tests-everything was fine   Hypertension     Surgical History: Past Surgical History:  Procedure Laterality Date   LEG SURGERY     SHOULDER SURGERY     TESTICLE SURGERY     TOTAL HIP ARTHROPLASTY Left 03/20/2019   Procedure: TOTAL HIP ARTHROPLASTY ANTERIOR APPROACH;  Surgeon: Rod Can, MD;  Location: [facility];  Service: Orthopedics;  Laterality: Left;    Home Medications:  Allergies as of 01/28/2021   No Known Allergies      Medication List        Accurate as of January 28, 2021  2:58 PM. If you have any questions, ask your nurse or doctor.          amLODipine 5 MG tablet Commonly known as: NORVASC Take 5 mg by mouth daily.   aspirin 81 MG chewable tablet aspirin 81 mg chewable tablet   baclofen 10 MG tablet Commonly known as: LIORESAL Take 10 mg by mouth daily.   lisinopril-hydrochlorothiazide 20-25 MG tablet Commonly known as: ZESTORETIC Take 1 tablet by mouth daily before breakfast.   meloxicam 15 MG tablet Commonly known as: MOBIC Take 15 mg by mouth daily.   ondansetron 4 MG tablet Commonly known as: Zofran Take 1 tablet (4 mg total) by mouth every 8 (eight) hours as needed for nausea or vomiting.   tamsulosin 0.4 MG Caps capsule Commonly known as: FLOMAX Take 1 capsule (0.4 mg total) by mouth daily after  supper.        Allergies: No Known Allergies  Family History: History reviewed. No pertinent family history.  Social History:  reports that he has never smoked. He has never used smokeless tobacco. He reports that he does not drink alcohol and does not use drugs.   Physical Exam: BP 120/76    Pulse 92    Ht 6\' 1"  (1.854 m)    Wt 190 lb (86.2 kg)    BMI 25.07 kg/m   Constitutional:  Alert and oriented, No acute distress. HEENT: [place] AT, moist mucus membranes.  Trachea midline, no masses. Cardiovascular: No clubbing, cyanosis, or edema. Respiratory: Normal respiratory effort, no increased work of breathing. GI: Abdomen is soft, nontender, nondistended, no abdominal masses GU: No CVA tenderness Skin: No rashes, bruises or suspicious lesions. Neurologic: Grossly intact, no focal deficits, moving all 4 extremities. Psychiatric: Normal mood and affect.  Laboratory Data:  Urinalysis Appearance-yellow/clear Dipstick-1+ blood/trace ketones/trace protein Microscopy negative   Assessment & Plan:    1.  Asymptomatic microhematuria AUA hematuria risk stratification: High based on age UA today negative We discussed the recommended evaluation for high risk hematuria of CT urogram and cystoscopy.  The procedures were discussed in detail and he has elected to proceed CTU order placed and cystoscopy scheduled   [name], MD  [facility]  [address] [place], [place] [address] [phone]

## 2021-01-28 NOTE — Patient Instructions (Signed)

## 2021-01-29 LAB — MICROSCOPIC EXAMINATION: Bacteria, UA: NONE SEEN

## 2021-01-29 LAB — URINALYSIS, COMPLETE
Bilirubin, UA: NEGATIVE
Glucose, UA: NEGATIVE
Leukocytes,UA: NEGATIVE
Nitrite, UA: NEGATIVE
Specific Gravity, UA: 1.025 (ref 1.005–1.030)
Urobilinogen, Ur: 0.2 mg/dL (ref 0.2–1.0)
pH, UA: 5.5 (ref 5.0–7.5)

## 2021-01-30 ENCOUNTER — Encounter: Payer: Self-pay | Admitting: Urology

## 2021-02-26 ENCOUNTER — Ambulatory Visit
Admission: RE | Admit: 2021-02-26 | Discharge: 2021-02-26 | Disposition: A | Discharge status: Home / Self Care | Payer: BC Managed Care – PPO | Source: Ambulatory Visit | Attending: Urology | Admitting: Urology

## 2021-02-26 DIAGNOSIS — R3129 Other microscopic hematuria: Secondary | ICD-10-CM | POA: Diagnosis present

## 2021-02-26 LAB — POCT I-STAT CREATININE: Creatinine, Ser: 1.1 mg/dL (ref 0.61–1.24)

## 2021-02-26 MED ORDER — IOHEXOL 300 MG/ML  SOLN
100.0000 mL | Freq: Once | INTRAMUSCULAR | Status: AC | PRN
  Administered 2021-02-26: 100 mL via INTRAVENOUS

## 2021-03-01 ENCOUNTER — Other Ambulatory Visit: Payer: Self-pay

## 2021-03-01 ENCOUNTER — Encounter: Payer: Self-pay | Admitting: Urology

## 2021-03-01 ENCOUNTER — Ambulatory Visit (INDEPENDENT_AMBULATORY_CARE_PROVIDER_SITE_OTHER): Payer: BC Managed Care – PPO | Admitting: Urology

## 2021-03-01 VITALS — BP 174/98 | HR 73 | Ht 70.0 in | Wt 190.0 lb

## 2021-03-01 DIAGNOSIS — R3129 Other microscopic hematuria: Secondary | ICD-10-CM | POA: Diagnosis not present

## 2021-03-01 LAB — URINALYSIS, COMPLETE
Bilirubin, UA: NEGATIVE
Glucose, UA: NEGATIVE
Ketones, UA: NEGATIVE
Leukocytes,UA: NEGATIVE
Nitrite, UA: NEGATIVE
Protein,UA: NEGATIVE
Specific Gravity, UA: 1.025 (ref 1.005–1.030)
Urobilinogen, Ur: 0.2 mg/dL (ref 0.2–1.0)
pH, UA: 6.5 (ref 5.0–7.5)

## 2021-03-01 LAB — MICROSCOPIC EXAMINATION
Bacteria, UA: NONE SEEN
Epithelial Cells (non renal): NONE SEEN /hpf (ref 0–10)

## 2021-03-01 NOTE — Progress Notes (Signed)
° °  03/01/21  CC:  Chief Complaint  Patient presents with   Cysto    Indications: High risk hematuria based on age.  CTU 02/27/2019 without significant upper tract abnormalities.  UA today 3-10 RBC   HPI:  No complaints today  Blood pressure (!) 174/98, pulse 73, height 5\' 10"  (1.778 m), weight 190 lb (86.2 kg). NED. A&Ox3.   No respiratory distress   Abd soft, NT, ND Normal phallus with bilateral descended testicles  Cystoscopy Procedure Note  Patient identification was confirmed, informed consent was obtained, and patient was prepped using Betadine solution.  Lidocaine jelly was administered per urethral meatus.     Pre-Procedure: - Inspection reveals a normal caliber urethral meatus.  Procedure: The flexible cystoscope was introduced without difficulty - No urethral strictures/lesions are present. 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

## 2022-03-02 ENCOUNTER — Ambulatory Visit: Payer: Medicare HMO | Admitting: Urology

## 2022-03-02 ENCOUNTER — Encounter: Payer: Self-pay | Admitting: Urology

## 2022-03-02 VITALS — BP 181/111 | HR 64 | Ht 73.0 in | Wt 185.0 lb

## 2022-03-02 DIAGNOSIS — R3129 Other microscopic hematuria: Secondary | ICD-10-CM | POA: Diagnosis not present

## 2022-03-02 DIAGNOSIS — N401 Enlarged prostate with lower urinary tract symptoms: Secondary | ICD-10-CM | POA: Diagnosis not present

## 2022-03-02 LAB — MICROSCOPIC EXAMINATION

## 2022-03-02 LAB — URINALYSIS, COMPLETE
Bilirubin, UA: NEGATIVE
Glucose, UA: NEGATIVE
Ketones, UA: NEGATIVE
Leukocytes,UA: NEGATIVE
Nitrite, UA: NEGATIVE
Protein,UA: NEGATIVE
Specific Gravity, UA: 1.025 (ref 1.005–1.030)
Urobilinogen, Ur: 0.2 mg/dL (ref 0.2–1.0)
pH, UA: 6 (ref 5.0–7.5)

## 2022-03-02 NOTE — Progress Notes (Signed)
   03/02/2022 11:46 AM   Lee Jenkins November 05, 1955 [id number]  Referring provider: Perrin Maltese, MD [address] [place],  [place] [address]  Chief Complaint  Patient presents with   Other    Urologic history:  1.  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  BPH with LUTS Previously on tamsulosin  HPI: 67 y.o. male presents for annual follow-up.  Doing well since last visit No bothersome LUTS Denies dysuria, gross hematuria Denies flank, abdominal or pelvic pain  PMH: Past Medical History:  Diagnosis Date   Arthritis    Heart murmur    told initially but after cardiac tests-everything was fine   Hypertension     Surgical History: Past Surgical History:  Procedure Laterality Date   LEG SURGERY     SHOULDER SURGERY     TESTICLE SURGERY     TOTAL HIP ARTHROPLASTY Left 03/20/2019   Procedure: TOTAL HIP ARTHROPLASTY ANTERIOR APPROACH;  Surgeon: Rod Can, MD;  Location: [facility];  Service: Orthopedics;  Laterality: Left;    Home Medications:  Allergies as of 03/02/2022   No Known Allergies      Medication List        Accurate as of March 02, 2022 11:46 AM. If you have any questions, ask your nurse or doctor.          amLODipine 5 MG tablet Commonly known as: NORVASC Take 5 mg by mouth daily.   aspirin 81 MG chewable tablet aspirin 81 mg chewable tablet   baclofen 10 MG tablet Commonly known as: LIORESAL Take 10 mg by mouth daily.   lisinopril-hydrochlorothiazide 20-25 MG tablet Commonly known as: ZESTORETIC Take 1 tablet by mouth daily before breakfast.   meloxicam 15 MG tablet Commonly known as: MOBIC Take 15 mg by mouth daily.   ondansetron 4 MG tablet Commonly known as: Zofran Take 1 tablet (4 mg total) by mouth every 8 (eight) hours as needed for nausea or  vomiting.   tamsulosin 0.4 MG Caps capsule Commonly known as: FLOMAX Take 1 capsule (0.4 mg total) by mouth daily after supper.        Allergies: No Known Allergies  Family History: No family history on file.  Social History:  reports that he has never smoked. He has never used smokeless tobacco. He reports that he does not drink alcohol and does not use drugs.   Physical Exam: BP (!) 181/111   Pulse 64   Ht 6\' 1"  (1.854 m)   Wt 185 lb (83.9 kg)   BMI 24.41 kg/m   Constitutional:  Alert and oriented, No acute distress. HEENT: [place] AT Respiratory: Normal respiratory effort, no increased work of breathing. Psychiatric: Normal mood and affect.  Laboratory Data:  Urinalysis Microscopy 11-30 RBC   Assessment & Plan:    1. Microhematuria UA today with 11-30 RBC however only trace blood on dipstick Lab visit for repeat UA in approximately 1 month.  If persistent 11-30 RBCs or greater recommend reevaluation with CTU/cystoscopy  2.  BPH with LUTS Mild symptoms which are not bothersome Does not desire to restart tamsulosin No recent PSA on record review and PSA ordered at time of repeat Seltzer, MD  [facility] [address] [place], [place] [address] [phone]

## 2022-03-03 ENCOUNTER — Encounter: Payer: Self-pay | Admitting: Urology

## 2022-03-30 ENCOUNTER — Other Ambulatory Visit: Payer: Self-pay | Admitting: Family Medicine

## 2022-03-30 ENCOUNTER — Encounter: Payer: Self-pay | Admitting: Urology

## 2022-03-30 ENCOUNTER — Other Ambulatory Visit: Payer: Medicare HMO

## 2022-03-30 DIAGNOSIS — N401 Enlarged prostate with lower urinary tract symptoms: Secondary | ICD-10-CM

## 2022-03-30 DIAGNOSIS — R3129 Other microscopic hematuria: Secondary | ICD-10-CM

## 2022-04-01 ENCOUNTER — Other Ambulatory Visit: Payer: Medicare HMO

## 2022-04-01 DIAGNOSIS — N401 Enlarged prostate with lower urinary tract symptoms: Secondary | ICD-10-CM

## 2022-04-01 DIAGNOSIS — R3129 Other microscopic hematuria: Secondary | ICD-10-CM

## 2022-04-01 LAB — MICROSCOPIC EXAMINATION

## 2022-04-01 LAB — URINALYSIS, COMPLETE
Bilirubin, UA: NEGATIVE
Glucose, UA: NEGATIVE
Ketones, UA: NEGATIVE
Leukocytes,UA: NEGATIVE
Nitrite, UA: NEGATIVE
Protein,UA: NEGATIVE
Specific Gravity, UA: 1.01 (ref 1.005–1.030)
Urobilinogen, Ur: 0.2 mg/dL (ref 0.2–1.0)
pH, UA: 6 (ref 5.0–7.5)

## 2022-04-02 LAB — PSA: Prostate Specific Ag, Serum: 5.4 ng/mL — ABNORMAL HIGH (ref 0.0–4.0)

## 2022-04-07 DIAGNOSIS — Z79899 Other long term (current) drug therapy: Secondary | ICD-10-CM | POA: Diagnosis not present

## 2022-04-11 ENCOUNTER — Telehealth: Payer: Self-pay

## 2022-04-11 DIAGNOSIS — R972 Elevated prostate specific antigen [PSA]: Secondary | ICD-10-CM

## 2022-04-11 NOTE — Telephone Encounter (Signed)
Patient advised. Order placed. Discuss preparation for the MRI with the patient.

## 2022-04-11 NOTE — Telephone Encounter (Signed)
-----   Message from [name], MD sent at 04/11/2022  7:52 AM EDT ----- Repeat urinalysis showed only minimal microscopic hematuria and can continue to follow.  His PSA was slightly elevated at 5.4.  Recommend scheduling prostate MRI for further evaluation.  Please place order and will call with results

## 2022-04-18 DIAGNOSIS — R3129 Other microscopic hematuria: Secondary | ICD-10-CM | POA: Diagnosis not present

## 2022-04-18 DIAGNOSIS — Z0289 Encounter for other administrative examinations: Secondary | ICD-10-CM | POA: Diagnosis not present

## 2022-04-18 DIAGNOSIS — Z79899 Other long term (current) drug therapy: Secondary | ICD-10-CM | POA: Diagnosis not present

## 2022-04-18 DIAGNOSIS — M160 Bilateral primary osteoarthritis of hip: Secondary | ICD-10-CM | POA: Diagnosis not present

## 2022-04-18 DIAGNOSIS — I10 Essential (primary) hypertension: Secondary | ICD-10-CM | POA: Diagnosis not present

## 2022-04-23 ENCOUNTER — Ambulatory Visit
Admission: RE | Admit: 2022-04-23 | Discharge: 2022-04-23 | Disposition: A | Discharge status: Home / Self Care | Payer: Medicare HMO | Source: Ambulatory Visit | Attending: Urology | Admitting: Urology

## 2022-04-23 DIAGNOSIS — R972 Elevated prostate specific antigen [PSA]: Secondary | ICD-10-CM | POA: Diagnosis not present

## 2022-04-23 MED ORDER — GADOBUTROL 1 MMOL/ML IV SOLN
7.5000 mL | Freq: Once | INTRAVENOUS | Status: AC | PRN
  Administered 2022-04-23: 7.5 mL via INTRAVENOUS

## 2022-04-26 ENCOUNTER — Telehealth: Payer: Self-pay | Admitting: Urology

## 2022-04-26 NOTE — Telephone Encounter (Signed)
Called patient to discuss prostate MRI results.  Left message on VM to call office

## 2022-05-10 NOTE — Telephone Encounter (Signed)
Notified patient as instructed wife, They will call us back to let us know next step

## 2022-05-10 NOTE — Telephone Encounter (Signed)
I called patient earlier this month to discuss his prostate MRI results and left a voicemail asking him to call back.  I have not heard from him.  Does not look like he has checked his MyChart since 04/27/2022.  Please let him know his MRI did show an abnormality suspicious for prostate cancer and would recommend scheduling a fusion biopsy.  If he has any questions I am happy to discuss with him either on the phone or an office visit.  If he is good with scheduling can send a message to schedule fusion biopsy with Dr. Richardo Hanks

## 2022-05-11 ENCOUNTER — Other Ambulatory Visit: Payer: Self-pay | Admitting: Family Medicine

## 2022-05-11 NOTE — Telephone Encounter (Signed)
Patient returned call and wants to schedule the Fusion Biopsy.

## 2022-06-28 ENCOUNTER — Telehealth: Payer: Self-pay | Admitting: *Deleted

## 2022-06-28 NOTE — Telephone Encounter (Signed)
Patient scheduled for fusion biopsy 08/24/2022. Patient will call us back because he was driving for instructions.   Prostate Biopsy Instructions  Stop all aspirin or blood thinners (aspirin, plavix, coumadin, warfarin, motrin, ibuprofen, advil, aleve, naproxen, naprosyn) for 7 days prior to the procedure.  If you have any questions about stopping these medications, please contact your primary care physician or cardiologist.  Having a light meal prior to the procedure is recommended.  If you are diabetic or have low blood sugar please bring a small snack or glucose tablet.  A Fleets enema is needed to be purchased over the counter at a local pharmacy and used 2 hours before you scheduled appointment.  This can be purchased over the counter at any pharmacy.  Antibiotics will be administered in the clinic at the time of the procedure unless otherwise specified.    Please bring someone with you to the procedure to drive you home.  A follow up appointment has been scheduled for you to receive the results of the biopsy.  If you have any questions or concerns, please feel free to call the office at [phone] or send a Mychart message.    Thank you, Staff at [facility]

## 2022-06-28 NOTE — Telephone Encounter (Signed)
-----   Message from Riki Altes, MD sent at 06/27/2022  9:24 PM EDT ----- Regarding: fusion bx Please sched pt for fusion bx

## 2022-06-30 ENCOUNTER — Ambulatory Visit
Admission: RE | Admit: 2022-06-30 | Discharge: 2022-06-30 | Disposition: A | Discharge status: Home / Self Care | Payer: Medicare HMO | Source: Ambulatory Visit | Attending: Family Medicine | Admitting: Family Medicine

## 2022-06-30 ENCOUNTER — Other Ambulatory Visit: Payer: Self-pay | Admitting: Family Medicine

## 2022-06-30 DIAGNOSIS — M25561 Pain in right knee: Secondary | ICD-10-CM

## 2022-06-30 DIAGNOSIS — M25562 Pain in left knee: Secondary | ICD-10-CM

## 2022-08-24 ENCOUNTER — Encounter: Payer: Self-pay | Admitting: Urology

## 2022-08-24 ENCOUNTER — Other Ambulatory Visit: Payer: Self-pay | Admitting: Urology

## 2022-08-24 ENCOUNTER — Ambulatory Visit: Payer: Medicare HMO | Admitting: Urology

## 2022-08-24 VITALS — BP 162/88 | HR 66 | Ht 73.0 in | Wt 185.0 lb

## 2022-08-24 DIAGNOSIS — C61 Malignant neoplasm of prostate: Secondary | ICD-10-CM | POA: Diagnosis not present

## 2022-08-24 DIAGNOSIS — R972 Elevated prostate specific antigen [PSA]: Secondary | ICD-10-CM

## 2022-08-24 DIAGNOSIS — Z2989 Encounter for other specified prophylactic measures: Secondary | ICD-10-CM

## 2022-08-24 DIAGNOSIS — N4231 Prostatic intraepithelial neoplasia: Secondary | ICD-10-CM | POA: Diagnosis not present

## 2022-08-24 DIAGNOSIS — N4232 Atypical small acinar proliferation of prostate: Secondary | ICD-10-CM | POA: Diagnosis not present

## 2022-08-24 MED ORDER — GENTAMICIN SULFATE 40 MG/ML IJ SOLN
80.0000 mg | Freq: Once | INTRAMUSCULAR | Status: AC
  Administered 2022-08-24: 80 mg via INTRAMUSCULAR

## 2022-08-24 MED ORDER — ONDANSETRON HCL 4 MG PO TABS: 4.0000 mg | ORAL_TABLET | Freq: Three times a day (TID) | ORAL | 0 refills | Status: DC | PRN

## 2022-08-24 MED ORDER — LEVOFLOXACIN 500 MG PO TABS
500.0000 mg | ORAL_TABLET | Freq: Once | ORAL | Status: AC
  Administered 2022-08-24: 500 mg via ORAL

## 2022-08-24 NOTE — Patient Instructions (Signed)

## 2022-08-24 NOTE — Progress Notes (Signed)
   08/24/22  Indication: Elevated PSA, 5.4, abnormal prostate MRI  MRI Fusion Prostate Biopsy Procedure   Informed consent was obtained, and we discussed the risks of bleeding and infection/sepsis. A time out was performed to ensure correct patient identity.  Pre-Procedure: - Last PSA Level: 5.4 - Gentamicin and levaquin given for antibiotic prophylaxis -Prostate measured 51 g on MRI, PSA density 0.11 - No significant hypoechoic or median lobe noted  Procedure: - Prostate block performed using 10 cc 1% lidocaine  - MRI fusion biopsy was performed, and 3 biopsies were taken from the ROI#1 PIRADS4 lesion located left lateral mid/apex - Standard biopsies taken from sextant areas, 12 under ultrasound guidance. - Total of 15 cores taken  Post-Procedure: - Patient tolerated the procedure well - He was counseled to seek immediate medical attention if experiences significant bleeding, fevers, or severe pain - Return in one week to discuss biopsy results  Assessment/ Plan: Will follow up in 1-2 weeks to discuss pathology with Dr. Mort Sawyers, MD 08/24/2022

## 2022-09-01 ENCOUNTER — Encounter: Payer: Self-pay | Admitting: Urology

## 2022-09-01 ENCOUNTER — Ambulatory Visit: Payer: Medicare HMO | Admitting: Urology

## 2022-09-01 VITALS — BP 122/70 | HR 64 | Ht 73.0 in | Wt 179.0 lb

## 2022-09-01 DIAGNOSIS — C61 Malignant neoplasm of prostate: Secondary | ICD-10-CM

## 2022-09-01 NOTE — Progress Notes (Signed)
I,Dina M Abdulla,acting as a scribe for Lee Altes, MD.,have documented all relevant documentation on the behalf of Lee Altes, MD,as directed by  Lee Altes, MD while in the presence of Lee Altes, MD.  09/01/2022 5:10 PM   Cherie Ouch [birth year] [id number]  Referring provider: Margaretann Loveless, MD [address] [place],  [place] [address]  Chief Complaint  Patient presents with   BIOPSY RESULTS    Urologic history:  1.  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  BPH with LUTS Previously on tamsulosin  HPI: 67 y.o. male presents for prostate biopsy follow up.  PSA 5.2 with a PI-RADS4 lesion of left prostate on MRI. MR fusion biopsy by Dr. Richardo Hanks 08/24/22 (standard 12 core template and 3 ROI biopsies). No post-biopsy complaints. Pathology: Gleason 4+4 adenocarcinoma ROI biopsy involving 12% of the submitted tissue; gleason 3+4 adenocarcinoma left lateral mid/eft apex involving 6% and 14% of submitted tissue respectively; gleason 3+3 adenocarcinoma, right lateral mid/right apex involving 12 and 8% of submitted tissue.   PMH: Past Medical History:  Diagnosis Date   Arthritis    Heart murmur    told initially but after cardiac tests-everything was fine   Hypertension     Surgical History: Past Surgical History:  Procedure Laterality Date   LEG SURGERY     SHOULDER SURGERY     TESTICLE SURGERY     TOTAL HIP ARTHROPLASTY Left 03/20/2019   Procedure: TOTAL HIP ARTHROPLASTY ANTERIOR APPROACH;  Surgeon: Samson Frederic, MD;  Location: [facility];  Service: Orthopedics;  Laterality: Left;    Home Medications:  Allergies as of 09/01/2022   No Known Allergies      Medication List        Accurate as of September 01, 2022  5:10 PM. If you have any questions, ask your nurse or doctor.           lisinopril-hydrochlorothiazide 20-25 MG tablet Commonly known as: ZESTORETIC Take 1 tablet by mouth daily before breakfast.   ondansetron 4 MG tablet Commonly known as: Zofran Take 1 tablet (4 mg total) by mouth every 8 (eight) hours as needed for nausea or vomiting.         Social History:  reports that he has never smoked. He has never been exposed to tobacco smoke. He has never used smokeless tobacco. He reports that he does not drink alcohol and does not use drugs.   Physical Exam: BP 122/70   Pulse 64   Ht 6\' 1"  (1.854 m)   Wt 179 lb (81.2 kg)   BMI 23.62 kg/m   Constitutional:  Alert and oriented, No acute distress. HEENT: [place] AT Respiratory: Normal respiratory effort, no increased work of breathing. Psychiatric: Normal mood and affect.    Assessment & Plan:    1. Clinical T1c high risk prostate cancer MRI with evidence transcapsular spread at ROI, no adenopathy or seminal vesicle involvement. Based on hish risk disease, we'll schedule PSMA/PET for staging. If no evidence of metastatic disease, we discussed curative options of RALP and radiation modalities. The procedures and potential side effects were discussed in detail. He states he is not interested in surgery and has requested referral to radiation oncology. PSMA/PET ordered. Radiation oncology referral placed.  I have reviewed the above documentation for accuracy and completeness, and I agree with the  above.   Lee Altes, MD  [facility] [address], [address] [place], [place] [address] [phone]

## 2022-09-02 ENCOUNTER — Telehealth: Payer: Self-pay | Admitting: *Deleted

## 2022-09-02 NOTE — Telephone Encounter (Signed)
Message left regarding consult appointment with Dr. Rushie Chestnut on Aug 20 @ 11am.  Also let the patient know that if this date/time does not work for him to call us at  [phone] to reschedule.

## 2022-09-13 ENCOUNTER — Ambulatory Visit
Admission: RE | Admit: 2022-09-13 | Discharge: 2022-09-13 | Disposition: A | Discharge status: Home / Self Care | Payer: Medicare HMO | Source: Ambulatory Visit | Attending: Radiation Oncology | Admitting: Radiation Oncology

## 2022-09-13 ENCOUNTER — Encounter: Payer: Self-pay | Admitting: Radiation Oncology

## 2022-09-13 VITALS — BP 131/86 | HR 65 | Temp 97.3°F | Resp 16 | Ht 73.0 in | Wt 182.0 lb

## 2022-09-13 DIAGNOSIS — R011 Cardiac murmur, unspecified: Secondary | ICD-10-CM | POA: Diagnosis not present

## 2022-09-13 DIAGNOSIS — I10 Essential (primary) hypertension: Secondary | ICD-10-CM | POA: Diagnosis not present

## 2022-09-13 DIAGNOSIS — C61 Malignant neoplasm of prostate: Secondary | ICD-10-CM | POA: Diagnosis present

## 2022-09-13 DIAGNOSIS — Z79899 Other long term (current) drug therapy: Secondary | ICD-10-CM | POA: Diagnosis not present

## 2022-09-13 NOTE — Consult Note (Signed)
NEW PATIENT EVALUATION  Name: Lee Jenkins  MRN: [id number]  Date:   09/13/2022     DOB: February 15, 1955   This 67 y.o. 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  REFERRING PHYSICIAN: Leanord Asal, Consuelo *  CHIEF COMPLAINT:  Chief Complaint  Patient presents with   Prostate Cancer    DIAGNOSIS: The encounter diagnosis was Malignant neoplasm of prostate (HCC).   PREVIOUS INVESTIGATIONS:  MRI scan reviewed PET CT scan to be done tomorrow Pathology report reviewed Clinical notes reviewed  HPI: Patient is a 67 year old male who presented with a rising PSA in the 5.6 range.  Prostate MRI was performed showing a 2.1 x 0.9 cm abnormality within the lateral left base a PI-RADS 4 lesion.  Also possible small volume trans capsular spread.  Patient underwent biopsy showing 5 of 13 cores positive for mixture of Gleason 7 (3+4) Gleason 6 (3+3) in 1 core positive for Gleason 8 (4+4).  His prostate volume was 52 cc.  Treatment options including robot-assisted prostatectomy been discussed by urology he is now referred to radiation oncology for consideration of treatment.  He is fairly asymptomatic.  Specifically denies any frequency urgency or nocturia.  Bowel function is tends towards hard stools with some occasional blood per rectum.  He does occasionally take stool softener.  PLANNED TREATMENT REGIMEN: Image guided IMRT radiation therapy  PAST MEDICAL HISTORY:  has a past medical history of Arthritis, Heart murmur, and Hypertension.    PAST SURGICAL HISTORY:  Past Surgical History:  Procedure Laterality Date   LEG SURGERY     SHOULDER SURGERY     TESTICLE SURGERY     TOTAL HIP ARTHROPLASTY Left 03/20/2019   Procedure: TOTAL HIP ARTHROPLASTY ANTERIOR APPROACH;  Surgeon: Samson Frederic, MD;  Location: [facility];  Service: Orthopedics;  Laterality: Left;    FAMILY HISTORY: family  history is not on file.  SOCIAL HISTORY:  reports that he has never smoked. He has never been exposed to tobacco smoke. He has never used smokeless tobacco. He reports that he does not drink alcohol and does not use drugs.  ALLERGIES: Patient has no known allergies.  MEDICATIONS:  Current Outpatient Medications  Medication Sig Dispense Refill   lisinopril-hydrochlorothiazide (ZESTORETIC) 20-25 MG tablet Take 1 tablet by mouth daily before breakfast.     No current facility-administered medications for this encounter.    ECOG PERFORMANCE STATUS:  0 - Asymptomatic  REVIEW OF SYSTEMS: Patient denies any weight loss, fatigue, weakness, fever, chills or night sweats. Patient denies any loss of vision, blurred vision. Patient denies any ringing  of the ears or hearing loss. No irregular heartbeat. Patient denies heart murmur or history of fainting. Patient denies any chest pain or pain radiating to her upper extremities. Patient denies any shortness of breath, difficulty breathing at night, cough or hemoptysis. Patient denies any swelling in the lower legs. Patient denies any nausea vomiting, vomiting of blood, or coffee ground material in the vomitus. Patient denies any stomach pain. Patient states has had normal bowel movements no significant constipation or diarrhea. Patient denies any dysuria, hematuria or significant nocturia. Patient denies any problems walking, swelling in the joints or loss of balance. Patient denies any skin changes, loss of hair or loss of weight. Patient denies any excessive worrying or anxiety or significant depression. Patient denies any problems with insomnia. Patient denies excessive thirst, polyuria, polydipsia. Patient  denies any swollen glands, patient denies easy bruising or easy bleeding. Patient denies any recent infections, allergies or URI. Patient "s visual fields have not changed significantly in recent time.   PHYSICAL EXAM: BP 131/86   Pulse 65   Temp (!)  97.3 F (36.3 C) (Tympanic)   Resp 16   Ht 6\' 1"  (1.854 m) Comment: stated ht  Wt 182 lb (82.6 kg)   BMI 24.01 kg/m  Well-developed well-nourished patient in NAD. HEENT reveals PERLA, EOMI, discs not visualized.  Oral cavity is clear. No oral mucosal lesions are identified. Neck is clear without evidence of cervical or supraclavicular adenopathy. Lungs are clear to A&P. Cardiac examination is essentially unremarkable with regular rate and rhythm without murmur rub or thrill. Abdomen is benign with no organomegaly or masses noted. Motor sensory and DTR levels are equal and symmetric in the upper and lower extremities. Cranial nerves II through XII are grossly intact. Proprioception is intact. No peripheral adenopathy or edema is identified. No motor or sensory levels are noted. Crude visual fields are within normal range.  LABORATORY DATA: Pathology report reviewed    RADIOLOGY RESULTS: MRI scan reviewed PSMA PET scan will be reviewed when available   IMPRESSION: Stage IIIa based on trans capsular extension mostly Gleason 7 (3+4) adenocarcinoma the prostate presenting with a PSA of 93.55 in 67 year old male  PLAN: At this time of gone over treatment options including I-125 interstitial implant as well as image guided IMRT radiation.  Patient is interested in daily radiation treatments.  Risks and benefits of treatment including increased lower urinary tract symptoms diarrhea fatigue alteration of blood counts all were discussed with the patient in detail.  I have asked urology to place fiducial markers in his prostate for daily image guided treatment and to administer one 57-month depot of Eligard.  We will set up simulation after markers are placed.  Patient comprehends my recommendations well.  I would like to take this opportunity to thank you for allowing me to participate in the care of your patient.Carmina Miller, MD

## 2022-09-14 ENCOUNTER — Ambulatory Visit
Admission: RE | Admit: 2022-09-14 | Discharge: 2022-09-14 | Disposition: A | Discharge status: Home / Self Care | Payer: Medicare HMO | Source: Ambulatory Visit | Attending: Urology | Admitting: Urology

## 2022-09-14 DIAGNOSIS — C61 Malignant neoplasm of prostate: Secondary | ICD-10-CM | POA: Diagnosis present

## 2022-09-14 MED ORDER — PIFLIFOLASTAT F 18 (PYLARIFY) INJECTION
9.0000 | Freq: Once | INTRAVENOUS | Status: AC
  Administered 2022-09-14: 9.7 via INTRAVENOUS

## 2022-09-29 ENCOUNTER — Encounter: Payer: Self-pay | Admitting: Urology

## 2022-09-29 ENCOUNTER — Ambulatory Visit: Payer: Medicare HMO | Admitting: Urology

## 2022-09-29 VITALS — BP 154/97 | HR 70 | Ht 70.0 in | Wt 200.0 lb

## 2022-09-29 DIAGNOSIS — C61 Malignant neoplasm of prostate: Secondary | ICD-10-CM | POA: Diagnosis not present

## 2022-09-29 DIAGNOSIS — Z2989 Encounter for other specified prophylactic measures: Secondary | ICD-10-CM

## 2022-09-29 MED ORDER — LEVOFLOXACIN 500 MG PO TABS
500.0000 mg | ORAL_TABLET | Freq: Once | ORAL | Status: AC
Start: 2022-09-29 — End: 2022-09-29
  Administered 2022-09-29: 500 mg via ORAL

## 2022-09-29 MED ORDER — LEUPROLIDE ACETATE (6 MONTH) 45 MG ~~LOC~~ KIT
45.0000 mg | PACK | Freq: Once | SUBCUTANEOUS | Status: AC
Start: 2022-09-29 — End: 2022-09-29
  Administered 2022-09-29: 45 mg via SUBCUTANEOUS

## 2022-09-29 MED ORDER — GENTAMICIN SULFATE 40 MG/ML IJ SOLN
80.0000 mg | Freq: Once | INTRAMUSCULAR | Status: AC
Start: 2022-09-29 — End: 2022-09-29
  Administered 2022-09-29: 80 mg via INTRAMUSCULAR

## 2022-09-29 NOTE — Progress Notes (Signed)
09/29/22  CC: gold fiducial marker placement  HPI: 67 y.o. male with prostate cancer who presents today for placement of fiducial seed markers in anticipation of his upcoming IMRT with Dr. Rushie Chestnut.    Prostate Gold fiducial Marker Placement Procedure   Informed consent was obtained after discussing risks/benefits of the procedure.  A time out was performed to ensure correct patient identity.  Pre-Procedure: - Gentamicin given prophylactically - PO Levaquin 500 mg also given today  Procedure: - Lidocaine jelly was administered per rectum - Rectal ultrasound probe was placed without difficulty and the prostate visualized - Prostatic block performed with 10 mL 1% Xylocaine - 3 fiducial gold seed markers placed, one at right base, one at left base, one at apex of prostate gland under transrectal ultrasound guidance  Post-Procedure: - Patient tolerated the procedure well - He was counseled to seek immediate medical attention if experiences any severe pain, significant bleeding, or fevers    Irineo Axon, MD

## 2022-09-29 NOTE — Progress Notes (Signed)
Eligard SubQ Injection   Due to Prostate Cancer patient is present today for a Eligard Injection.  Medication: Eligard 6 month Dose: 45 mg  Location: right  Lot: [id number] Exp: 01/2024  Patient tolerated well, no complications were noted  Performed by: Ples Specter CMA   Per Dr. Lonna Cobb patient is to continue therapy for   once . rRminder continue on Vitamin D 800-1000iu and Calcium 1000-1200mg  daily while on Androgen Deprivation Therapy.  PA approval dates:  no Auth needed.

## 2022-10-03 ENCOUNTER — Ambulatory Visit
Admission: RE | Admit: 2022-10-03 | Discharge: 2022-10-03 | Disposition: A | Discharge status: Home / Self Care | Payer: Medicare HMO | Source: Ambulatory Visit | Attending: Radiation Oncology | Admitting: Radiation Oncology

## 2022-10-03 DIAGNOSIS — R011 Cardiac murmur, unspecified: Secondary | ICD-10-CM | POA: Insufficient documentation

## 2022-10-03 DIAGNOSIS — I10 Essential (primary) hypertension: Secondary | ICD-10-CM | POA: Diagnosis not present

## 2022-10-03 DIAGNOSIS — Z79899 Other long term (current) drug therapy: Secondary | ICD-10-CM | POA: Diagnosis not present

## 2022-10-03 DIAGNOSIS — C61 Malignant neoplasm of prostate: Secondary | ICD-10-CM | POA: Diagnosis present

## 2022-10-07 ENCOUNTER — Other Ambulatory Visit: Payer: Self-pay | Admitting: *Deleted

## 2022-10-07 DIAGNOSIS — C61 Malignant neoplasm of prostate: Secondary | ICD-10-CM

## 2022-10-12 ENCOUNTER — Ambulatory Visit: Admission: RE | Admit: 2022-10-12 | Payer: Medicare HMO | Source: Ambulatory Visit

## 2022-10-13 ENCOUNTER — Other Ambulatory Visit: Payer: Self-pay

## 2022-10-13 ENCOUNTER — Ambulatory Visit
Admission: RE | Admit: 2022-10-13 | Discharge: 2022-10-13 | Disposition: A | Discharge status: Home / Self Care | Payer: Medicare HMO | Source: Ambulatory Visit | Attending: Radiation Oncology | Admitting: Radiation Oncology

## 2022-10-13 DIAGNOSIS — C61 Malignant neoplasm of prostate: Secondary | ICD-10-CM | POA: Diagnosis not present

## 2022-10-13 LAB — RAD ONC ARIA SESSION SUMMARY
Course Elapsed Days: 0
Plan Fractions Treated to Date: 1
Plan Prescribed Dose Per Fraction: 2.5 Gy
Plan Total Fractions Prescribed: 28
Plan Total Prescribed Dose: 70 Gy
Reference Point Dosage Given to Date: 2.5 Gy
Reference Point Session Dosage Given: 2.5 Gy
Session Number: 1

## 2022-10-14 ENCOUNTER — Other Ambulatory Visit: Payer: Self-pay

## 2022-10-14 ENCOUNTER — Ambulatory Visit: Payer: Medicare HMO

## 2022-10-14 ENCOUNTER — Ambulatory Visit
Admission: RE | Admit: 2022-10-14 | Discharge: 2022-10-14 | Disposition: A | Discharge status: Home / Self Care | Payer: Medicare HMO | Source: Ambulatory Visit | Attending: Radiation Oncology | Admitting: Radiation Oncology

## 2022-10-14 DIAGNOSIS — C61 Malignant neoplasm of prostate: Secondary | ICD-10-CM | POA: Diagnosis not present

## 2022-10-14 LAB — RAD ONC ARIA SESSION SUMMARY
Course Elapsed Days: 1
Plan Fractions Treated to Date: 2
Plan Prescribed Dose Per Fraction: 2.5 Gy
Plan Total Fractions Prescribed: 28
Plan Total Prescribed Dose: 70 Gy
Reference Point Dosage Given to Date: 5 Gy
Reference Point Session Dosage Given: 2.5 Gy
Session Number: 2

## 2022-10-17 ENCOUNTER — Other Ambulatory Visit: Payer: Self-pay

## 2022-10-17 ENCOUNTER — Ambulatory Visit
Admission: RE | Admit: 2022-10-17 | Discharge: 2022-10-17 | Disposition: A | Discharge status: Home / Self Care | Payer: Medicare HMO | Source: Ambulatory Visit | Attending: Radiation Oncology | Admitting: Radiation Oncology

## 2022-10-17 ENCOUNTER — Ambulatory Visit: Payer: Medicare HMO

## 2022-10-17 DIAGNOSIS — C61 Malignant neoplasm of prostate: Secondary | ICD-10-CM | POA: Diagnosis not present

## 2022-10-17 LAB — RAD ONC ARIA SESSION SUMMARY
Course Elapsed Days: 4
Plan Fractions Treated to Date: 3
Plan Prescribed Dose Per Fraction: 2.5 Gy
Plan Total Fractions Prescribed: 28
Plan Total Prescribed Dose: 70 Gy
Reference Point Dosage Given to Date: 7.5 Gy
Reference Point Session Dosage Given: 2.5 Gy
Session Number: 3

## 2022-10-18 ENCOUNTER — Other Ambulatory Visit: Payer: Self-pay

## 2022-10-18 ENCOUNTER — Ambulatory Visit
Admission: RE | Admit: 2022-10-18 | Discharge: 2022-10-18 | Disposition: A | Discharge status: Home / Self Care | Payer: Medicare HMO | Source: Ambulatory Visit | Attending: Radiation Oncology | Admitting: Radiation Oncology

## 2022-10-18 ENCOUNTER — Ambulatory Visit: Payer: Medicare HMO

## 2022-10-18 DIAGNOSIS — C61 Malignant neoplasm of prostate: Secondary | ICD-10-CM | POA: Diagnosis not present

## 2022-10-18 LAB — RAD ONC ARIA SESSION SUMMARY
Course Elapsed Days: 5
Plan Fractions Treated to Date: 4
Plan Prescribed Dose Per Fraction: 2.5 Gy
Plan Total Fractions Prescribed: 28
Plan Total Prescribed Dose: 70 Gy
Reference Point Dosage Given to Date: 10 Gy
Reference Point Session Dosage Given: 2.5 Gy
Session Number: 4

## 2022-10-19 ENCOUNTER — Ambulatory Visit: Payer: Medicare HMO

## 2022-10-19 ENCOUNTER — Inpatient Hospital Stay: Payer: Medicare HMO

## 2022-10-19 ENCOUNTER — Ambulatory Visit
Admission: RE | Admit: 2022-10-19 | Discharge: 2022-10-19 | Disposition: A | Discharge status: Home / Self Care | Payer: Medicare HMO | Source: Ambulatory Visit | Attending: Radiation Oncology | Admitting: Radiation Oncology

## 2022-10-19 ENCOUNTER — Other Ambulatory Visit: Payer: Self-pay

## 2022-10-19 DIAGNOSIS — C61 Malignant neoplasm of prostate: Secondary | ICD-10-CM | POA: Insufficient documentation

## 2022-10-19 LAB — CBC (CANCER CENTER ONLY)
HCT: 42 % (ref 39.0–52.0)
Hemoglobin: 13.2 g/dL (ref 13.0–17.0)
MCH: 25.5 pg — ABNORMAL LOW (ref 26.0–34.0)
MCHC: 31.4 g/dL (ref 30.0–36.0)
MCV: 81.2 fL (ref 80.0–100.0)
Platelet Count: 165 10*3/uL (ref 150–400)
RBC: 5.17 MIL/uL (ref 4.22–5.81)
RDW: 14.2 % (ref 11.5–15.5)
WBC Count: 3.8 10*3/uL — ABNORMAL LOW (ref 4.0–10.5)
nRBC: 0 % (ref 0.0–0.2)

## 2022-10-19 LAB — RAD ONC ARIA SESSION SUMMARY
Course Elapsed Days: 6
Plan Fractions Treated to Date: 5
Plan Prescribed Dose Per Fraction: 2.5 Gy
Plan Total Fractions Prescribed: 28
Plan Total Prescribed Dose: 70 Gy
Reference Point Dosage Given to Date: 12.5 Gy
Reference Point Session Dosage Given: 2.5 Gy
Session Number: 5

## 2022-10-20 ENCOUNTER — Ambulatory Visit
Admission: RE | Admit: 2022-10-20 | Discharge: 2022-10-20 | Disposition: A | Discharge status: Home / Self Care | Payer: Medicare HMO | Source: Ambulatory Visit | Attending: Radiation Oncology | Admitting: Radiation Oncology

## 2022-10-20 ENCOUNTER — Other Ambulatory Visit: Payer: Self-pay

## 2022-10-20 ENCOUNTER — Ambulatory Visit: Payer: Medicare HMO

## 2022-10-20 DIAGNOSIS — C61 Malignant neoplasm of prostate: Secondary | ICD-10-CM | POA: Diagnosis not present

## 2022-10-20 LAB — RAD ONC ARIA SESSION SUMMARY
Course Elapsed Days: 7
Plan Fractions Treated to Date: 6
Plan Prescribed Dose Per Fraction: 2.5 Gy
Plan Total Fractions Prescribed: 28
Plan Total Prescribed Dose: 70 Gy
Reference Point Dosage Given to Date: 15 Gy
Reference Point Session Dosage Given: 2.5 Gy
Session Number: 6

## 2022-10-21 ENCOUNTER — Ambulatory Visit
Admission: RE | Admit: 2022-10-21 | Discharge: 2022-10-21 | Disposition: A | Discharge status: Home / Self Care | Payer: Medicare HMO | Source: Ambulatory Visit | Attending: Radiation Oncology | Admitting: Radiation Oncology

## 2022-10-21 ENCOUNTER — Ambulatory Visit: Payer: Medicare HMO

## 2022-10-21 ENCOUNTER — Other Ambulatory Visit: Payer: Self-pay

## 2022-10-21 DIAGNOSIS — C61 Malignant neoplasm of prostate: Secondary | ICD-10-CM | POA: Diagnosis not present

## 2022-10-21 LAB — RAD ONC ARIA SESSION SUMMARY
Course Elapsed Days: 8
Plan Fractions Treated to Date: 7
Plan Prescribed Dose Per Fraction: 2.5 Gy
Plan Total Fractions Prescribed: 28
Plan Total Prescribed Dose: 70 Gy
Reference Point Dosage Given to Date: 17.5 Gy
Reference Point Session Dosage Given: 2.5 Gy
Session Number: 7

## 2022-10-24 ENCOUNTER — Ambulatory Visit: Payer: Medicare HMO

## 2022-10-24 ENCOUNTER — Ambulatory Visit
Admission: RE | Admit: 2022-10-24 | Discharge: 2022-10-24 | Disposition: A | Discharge status: Home / Self Care | Payer: Medicare HMO | Source: Ambulatory Visit | Attending: Radiation Oncology | Admitting: Radiation Oncology

## 2022-10-24 ENCOUNTER — Other Ambulatory Visit: Payer: Self-pay

## 2022-10-24 DIAGNOSIS — C61 Malignant neoplasm of prostate: Secondary | ICD-10-CM | POA: Diagnosis not present

## 2022-10-24 LAB — RAD ONC ARIA SESSION SUMMARY
Course Elapsed Days: 11
Plan Fractions Treated to Date: 8
Plan Prescribed Dose Per Fraction: 2.5 Gy
Plan Total Fractions Prescribed: 28
Plan Total Prescribed Dose: 70 Gy
Reference Point Dosage Given to Date: 20 Gy
Reference Point Session Dosage Given: 2.5 Gy
Session Number: 8

## 2022-10-25 ENCOUNTER — Ambulatory Visit
Admission: RE | Admit: 2022-10-25 | Discharge: 2022-10-25 | Disposition: A | Discharge status: Home / Self Care | Payer: Medicare HMO | Source: Ambulatory Visit | Attending: Radiation Oncology | Admitting: Radiation Oncology

## 2022-10-25 ENCOUNTER — Ambulatory Visit: Payer: Medicare HMO

## 2022-10-25 ENCOUNTER — Other Ambulatory Visit: Payer: Self-pay

## 2022-10-25 DIAGNOSIS — R011 Cardiac murmur, unspecified: Secondary | ICD-10-CM | POA: Insufficient documentation

## 2022-10-25 DIAGNOSIS — Z79899 Other long term (current) drug therapy: Secondary | ICD-10-CM | POA: Diagnosis not present

## 2022-10-25 DIAGNOSIS — C61 Malignant neoplasm of prostate: Secondary | ICD-10-CM | POA: Diagnosis present

## 2022-10-25 DIAGNOSIS — I10 Essential (primary) hypertension: Secondary | ICD-10-CM | POA: Insufficient documentation

## 2022-10-25 LAB — RAD ONC ARIA SESSION SUMMARY
Course Elapsed Days: 12
Plan Fractions Treated to Date: 9
Plan Prescribed Dose Per Fraction: 2.5 Gy
Plan Total Fractions Prescribed: 28
Plan Total Prescribed Dose: 70 Gy
Reference Point Dosage Given to Date: 22.5 Gy
Reference Point Session Dosage Given: 2.5 Gy
Session Number: 9

## 2022-10-26 ENCOUNTER — Ambulatory Visit: Payer: Medicare HMO

## 2022-10-26 ENCOUNTER — Other Ambulatory Visit: Payer: Self-pay

## 2022-10-26 ENCOUNTER — Ambulatory Visit
Admission: RE | Admit: 2022-10-26 | Discharge: 2022-10-26 | Disposition: A | Discharge status: Home / Self Care | Payer: Medicare HMO | Source: Ambulatory Visit | Attending: Radiation Oncology | Admitting: Radiation Oncology

## 2022-10-26 DIAGNOSIS — C61 Malignant neoplasm of prostate: Secondary | ICD-10-CM | POA: Diagnosis not present

## 2022-10-26 LAB — RAD ONC ARIA SESSION SUMMARY
Course Elapsed Days: 13
Plan Fractions Treated to Date: 10
Plan Prescribed Dose Per Fraction: 2.5 Gy
Plan Total Fractions Prescribed: 28
Plan Total Prescribed Dose: 70 Gy
Reference Point Dosage Given to Date: 25 Gy
Reference Point Session Dosage Given: 2.5 Gy
Session Number: 10

## 2022-10-27 ENCOUNTER — Ambulatory Visit: Payer: Medicare HMO

## 2022-10-28 ENCOUNTER — Ambulatory Visit: Payer: Medicare HMO

## 2022-10-28 ENCOUNTER — Other Ambulatory Visit: Payer: Self-pay

## 2022-10-28 ENCOUNTER — Ambulatory Visit
Admission: RE | Admit: 2022-10-28 | Discharge: 2022-10-28 | Disposition: A | Discharge status: Home / Self Care | Payer: Medicare HMO | Source: Ambulatory Visit | Attending: Radiation Oncology | Admitting: Radiation Oncology

## 2022-10-28 DIAGNOSIS — C61 Malignant neoplasm of prostate: Secondary | ICD-10-CM | POA: Diagnosis not present

## 2022-10-28 LAB — RAD ONC ARIA SESSION SUMMARY
Course Elapsed Days: 15
Plan Fractions Treated to Date: 11
Plan Prescribed Dose Per Fraction: 2.5 Gy
Plan Total Fractions Prescribed: 28
Plan Total Prescribed Dose: 70 Gy
Reference Point Dosage Given to Date: 27.5 Gy
Reference Point Session Dosage Given: 2.5 Gy
Session Number: 11

## 2022-10-31 ENCOUNTER — Other Ambulatory Visit: Payer: Self-pay

## 2022-10-31 ENCOUNTER — Ambulatory Visit: Payer: Medicare HMO

## 2022-10-31 ENCOUNTER — Ambulatory Visit
Admission: RE | Admit: 2022-10-31 | Discharge: 2022-10-31 | Disposition: A | Discharge status: Home / Self Care | Payer: Medicare HMO | Source: Ambulatory Visit | Attending: Radiation Oncology | Admitting: Radiation Oncology

## 2022-10-31 DIAGNOSIS — C61 Malignant neoplasm of prostate: Secondary | ICD-10-CM | POA: Diagnosis not present

## 2022-10-31 LAB — RAD ONC ARIA SESSION SUMMARY
Course Elapsed Days: 18
Plan Fractions Treated to Date: 12
Plan Prescribed Dose Per Fraction: 2.5 Gy
Plan Total Fractions Prescribed: 28
Plan Total Prescribed Dose: 70 Gy
Reference Point Dosage Given to Date: 30 Gy
Reference Point Session Dosage Given: 2.5 Gy
Session Number: 12

## 2022-11-01 ENCOUNTER — Ambulatory Visit
Admission: RE | Admit: 2022-11-01 | Discharge: 2022-11-01 | Disposition: A | Discharge status: Home / Self Care | Payer: Medicare HMO | Source: Ambulatory Visit | Attending: Radiation Oncology | Admitting: Radiation Oncology

## 2022-11-01 ENCOUNTER — Other Ambulatory Visit: Payer: Self-pay

## 2022-11-01 ENCOUNTER — Ambulatory Visit: Payer: Medicare HMO

## 2022-11-01 DIAGNOSIS — C61 Malignant neoplasm of prostate: Secondary | ICD-10-CM | POA: Diagnosis not present

## 2022-11-01 LAB — RAD ONC ARIA SESSION SUMMARY
Course Elapsed Days: 19
Plan Fractions Treated to Date: 13
Plan Prescribed Dose Per Fraction: 2.5 Gy
Plan Total Fractions Prescribed: 28
Plan Total Prescribed Dose: 70 Gy
Reference Point Dosage Given to Date: 32.5 Gy
Reference Point Session Dosage Given: 2.5 Gy
Session Number: 13

## 2022-11-02 ENCOUNTER — Ambulatory Visit
Admission: RE | Admit: 2022-11-02 | Discharge: 2022-11-02 | Disposition: A | Discharge status: Home / Self Care | Payer: Medicare HMO | Source: Ambulatory Visit | Attending: Radiation Oncology | Admitting: Radiation Oncology

## 2022-11-02 ENCOUNTER — Other Ambulatory Visit: Payer: Self-pay

## 2022-11-02 ENCOUNTER — Inpatient Hospital Stay: Payer: Medicare HMO

## 2022-11-02 ENCOUNTER — Ambulatory Visit: Payer: Medicare HMO

## 2022-11-02 DIAGNOSIS — C61 Malignant neoplasm of prostate: Secondary | ICD-10-CM | POA: Insufficient documentation

## 2022-11-02 LAB — CBC (CANCER CENTER ONLY)
HCT: 40.5 % (ref 39.0–52.0)
Hemoglobin: 12.9 g/dL — ABNORMAL LOW (ref 13.0–17.0)
MCH: 25.7 pg — ABNORMAL LOW (ref 26.0–34.0)
MCHC: 31.9 g/dL (ref 30.0–36.0)
MCV: 80.8 fL (ref 80.0–100.0)
Platelet Count: 155 10*3/uL (ref 150–400)
RBC: 5.01 MIL/uL (ref 4.22–5.81)
RDW: 14.1 % (ref 11.5–15.5)
WBC Count: 3.2 10*3/uL — ABNORMAL LOW (ref 4.0–10.5)
nRBC: 0 % (ref 0.0–0.2)

## 2022-11-02 LAB — RAD ONC ARIA SESSION SUMMARY
Course Elapsed Days: 20
Plan Fractions Treated to Date: 14
Plan Prescribed Dose Per Fraction: 2.5 Gy
Plan Total Fractions Prescribed: 28
Plan Total Prescribed Dose: 70 Gy
Reference Point Dosage Given to Date: 35 Gy
Reference Point Session Dosage Given: 2.5 Gy
Session Number: 14

## 2022-11-03 ENCOUNTER — Ambulatory Visit
Admission: RE | Admit: 2022-11-03 | Discharge: 2022-11-03 | Disposition: A | Discharge status: Home / Self Care | Payer: Medicare HMO | Source: Ambulatory Visit | Attending: Radiation Oncology | Admitting: Radiation Oncology

## 2022-11-03 ENCOUNTER — Ambulatory Visit: Payer: Medicare HMO

## 2022-11-03 ENCOUNTER — Other Ambulatory Visit: Payer: Self-pay

## 2022-11-03 DIAGNOSIS — C61 Malignant neoplasm of prostate: Secondary | ICD-10-CM | POA: Diagnosis not present

## 2022-11-03 LAB — RAD ONC ARIA SESSION SUMMARY
Course Elapsed Days: 21
Plan Fractions Treated to Date: 15
Plan Prescribed Dose Per Fraction: 2.5 Gy
Plan Total Fractions Prescribed: 28
Plan Total Prescribed Dose: 70 Gy
Reference Point Dosage Given to Date: 37.5 Gy
Reference Point Session Dosage Given: 2.5 Gy
Session Number: 15

## 2022-11-04 ENCOUNTER — Other Ambulatory Visit: Payer: Self-pay

## 2022-11-04 ENCOUNTER — Ambulatory Visit: Payer: Medicare HMO

## 2022-11-04 ENCOUNTER — Ambulatory Visit
Admission: RE | Admit: 2022-11-04 | Discharge: 2022-11-04 | Disposition: A | Discharge status: Home / Self Care | Payer: Medicare HMO | Source: Ambulatory Visit | Attending: Radiation Oncology | Admitting: Radiation Oncology

## 2022-11-04 DIAGNOSIS — C61 Malignant neoplasm of prostate: Secondary | ICD-10-CM | POA: Diagnosis not present

## 2022-11-04 LAB — RAD ONC ARIA SESSION SUMMARY
Course Elapsed Days: 22
Plan Fractions Treated to Date: 16
Plan Prescribed Dose Per Fraction: 2.5 Gy
Plan Total Fractions Prescribed: 28
Plan Total Prescribed Dose: 70 Gy
Reference Point Dosage Given to Date: 40 Gy
Reference Point Session Dosage Given: 2.5 Gy
Session Number: 16

## 2022-11-07 ENCOUNTER — Ambulatory Visit
Admission: RE | Admit: 2022-11-07 | Discharge: 2022-11-07 | Disposition: A | Discharge status: Home / Self Care | Payer: Medicare HMO | Source: Ambulatory Visit | Attending: Radiation Oncology | Admitting: Radiation Oncology

## 2022-11-07 ENCOUNTER — Ambulatory Visit: Payer: Medicare HMO

## 2022-11-07 ENCOUNTER — Other Ambulatory Visit: Payer: Self-pay

## 2022-11-07 DIAGNOSIS — C61 Malignant neoplasm of prostate: Secondary | ICD-10-CM | POA: Diagnosis not present

## 2022-11-07 LAB — RAD ONC ARIA SESSION SUMMARY
Course Elapsed Days: 25
Plan Fractions Treated to Date: 17
Plan Prescribed Dose Per Fraction: 2.5 Gy
Plan Total Fractions Prescribed: 28
Plan Total Prescribed Dose: 70 Gy
Reference Point Dosage Given to Date: 42.5 Gy
Reference Point Session Dosage Given: 2.5 Gy
Session Number: 17

## 2022-11-08 ENCOUNTER — Ambulatory Visit: Payer: Medicare HMO

## 2022-11-08 ENCOUNTER — Other Ambulatory Visit: Payer: Self-pay

## 2022-11-08 ENCOUNTER — Ambulatory Visit
Admission: RE | Admit: 2022-11-08 | Discharge: 2022-11-08 | Disposition: A | Discharge status: Home / Self Care | Payer: Medicare HMO | Source: Ambulatory Visit | Attending: Radiation Oncology | Admitting: Radiation Oncology

## 2022-11-08 DIAGNOSIS — C61 Malignant neoplasm of prostate: Secondary | ICD-10-CM | POA: Diagnosis not present

## 2022-11-08 LAB — RAD ONC ARIA SESSION SUMMARY
Course Elapsed Days: 26
Plan Fractions Treated to Date: 18
Plan Prescribed Dose Per Fraction: 2.5 Gy
Plan Total Fractions Prescribed: 28
Plan Total Prescribed Dose: 70 Gy
Reference Point Dosage Given to Date: 45 Gy
Reference Point Session Dosage Given: 2.5 Gy
Session Number: 18

## 2022-11-09 ENCOUNTER — Other Ambulatory Visit: Payer: Self-pay

## 2022-11-09 ENCOUNTER — Ambulatory Visit
Admission: RE | Admit: 2022-11-09 | Discharge: 2022-11-09 | Disposition: A | Discharge status: Home / Self Care | Payer: Medicare HMO | Source: Ambulatory Visit | Attending: Radiation Oncology | Admitting: Radiation Oncology

## 2022-11-09 ENCOUNTER — Ambulatory Visit: Payer: Medicare HMO

## 2022-11-09 DIAGNOSIS — C61 Malignant neoplasm of prostate: Secondary | ICD-10-CM | POA: Diagnosis not present

## 2022-11-09 LAB — RAD ONC ARIA SESSION SUMMARY
Course Elapsed Days: 27
Plan Fractions Treated to Date: 19
Plan Prescribed Dose Per Fraction: 2.5 Gy
Plan Total Fractions Prescribed: 28
Plan Total Prescribed Dose: 70 Gy
Reference Point Dosage Given to Date: 47.5 Gy
Reference Point Session Dosage Given: 2.5 Gy
Session Number: 19

## 2022-11-10 ENCOUNTER — Ambulatory Visit: Payer: Medicare HMO

## 2022-11-10 ENCOUNTER — Other Ambulatory Visit: Payer: Self-pay

## 2022-11-10 ENCOUNTER — Ambulatory Visit
Admission: RE | Admit: 2022-11-10 | Discharge: 2022-11-10 | Disposition: A | Discharge status: Home / Self Care | Payer: Medicare HMO | Source: Ambulatory Visit | Attending: Radiation Oncology | Admitting: Radiation Oncology

## 2022-11-10 DIAGNOSIS — C61 Malignant neoplasm of prostate: Secondary | ICD-10-CM | POA: Diagnosis not present

## 2022-11-10 LAB — RAD ONC ARIA SESSION SUMMARY
Course Elapsed Days: 28
Plan Fractions Treated to Date: 20
Plan Prescribed Dose Per Fraction: 2.5 Gy
Plan Total Fractions Prescribed: 28
Plan Total Prescribed Dose: 70 Gy
Reference Point Dosage Given to Date: 50 Gy
Reference Point Session Dosage Given: 2.5 Gy
Session Number: 20

## 2022-11-11 ENCOUNTER — Ambulatory Visit: Payer: Medicare HMO

## 2022-11-11 ENCOUNTER — Ambulatory Visit
Admission: RE | Admit: 2022-11-11 | Discharge: 2022-11-11 | Disposition: A | Discharge status: Home / Self Care | Payer: Medicare HMO | Source: Ambulatory Visit | Attending: Radiation Oncology | Admitting: Radiation Oncology

## 2022-11-11 ENCOUNTER — Other Ambulatory Visit: Payer: Self-pay

## 2022-11-11 DIAGNOSIS — C61 Malignant neoplasm of prostate: Secondary | ICD-10-CM | POA: Diagnosis not present

## 2022-11-11 LAB — RAD ONC ARIA SESSION SUMMARY
Course Elapsed Days: 29
Plan Fractions Treated to Date: 21
Plan Prescribed Dose Per Fraction: 2.5 Gy
Plan Total Fractions Prescribed: 28
Plan Total Prescribed Dose: 70 Gy
Reference Point Dosage Given to Date: 52.5 Gy
Reference Point Session Dosage Given: 2.5 Gy
Session Number: 21

## 2022-11-14 ENCOUNTER — Ambulatory Visit: Payer: Medicare HMO

## 2022-11-14 ENCOUNTER — Ambulatory Visit
Admission: RE | Admit: 2022-11-14 | Discharge: 2022-11-14 | Disposition: A | Discharge status: Home / Self Care | Payer: Medicare HMO | Source: Ambulatory Visit | Attending: Radiation Oncology | Admitting: Radiation Oncology

## 2022-11-14 ENCOUNTER — Other Ambulatory Visit: Payer: Self-pay

## 2022-11-14 DIAGNOSIS — C61 Malignant neoplasm of prostate: Secondary | ICD-10-CM | POA: Diagnosis not present

## 2022-11-14 LAB — RAD ONC ARIA SESSION SUMMARY
Course Elapsed Days: 32
Plan Fractions Treated to Date: 22
Plan Prescribed Dose Per Fraction: 2.5 Gy
Plan Total Fractions Prescribed: 28
Plan Total Prescribed Dose: 70 Gy
Reference Point Dosage Given to Date: 55 Gy
Reference Point Session Dosage Given: 2.5 Gy
Session Number: 22

## 2022-11-15 ENCOUNTER — Other Ambulatory Visit: Payer: Self-pay

## 2022-11-15 ENCOUNTER — Ambulatory Visit: Payer: Medicare HMO

## 2022-11-15 ENCOUNTER — Ambulatory Visit
Admission: RE | Admit: 2022-11-15 | Discharge: 2022-11-15 | Disposition: A | Discharge status: Home / Self Care | Payer: Medicare HMO | Source: Ambulatory Visit | Attending: Radiation Oncology | Admitting: Radiation Oncology

## 2022-11-15 DIAGNOSIS — C61 Malignant neoplasm of prostate: Secondary | ICD-10-CM | POA: Diagnosis not present

## 2022-11-15 LAB — RAD ONC ARIA SESSION SUMMARY
Course Elapsed Days: 33
Plan Fractions Treated to Date: 23
Plan Prescribed Dose Per Fraction: 2.5 Gy
Plan Total Fractions Prescribed: 28
Plan Total Prescribed Dose: 70 Gy
Reference Point Dosage Given to Date: 57.5 Gy
Reference Point Session Dosage Given: 2.5 Gy
Session Number: 23

## 2022-11-16 ENCOUNTER — Ambulatory Visit
Admission: RE | Admit: 2022-11-16 | Discharge: 2022-11-16 | Disposition: A | Discharge status: Home / Self Care | Payer: Medicare HMO | Source: Ambulatory Visit | Attending: Radiation Oncology | Admitting: Radiation Oncology

## 2022-11-16 ENCOUNTER — Other Ambulatory Visit: Payer: Self-pay

## 2022-11-16 ENCOUNTER — Ambulatory Visit: Payer: Medicare HMO

## 2022-11-16 ENCOUNTER — Inpatient Hospital Stay: Payer: Medicare HMO

## 2022-11-16 DIAGNOSIS — C61 Malignant neoplasm of prostate: Secondary | ICD-10-CM

## 2022-11-16 LAB — RAD ONC ARIA SESSION SUMMARY
Course Elapsed Days: 34
Plan Fractions Treated to Date: 24
Plan Prescribed Dose Per Fraction: 2.5 Gy
Plan Total Fractions Prescribed: 28
Plan Total Prescribed Dose: 70 Gy
Reference Point Dosage Given to Date: 60 Gy
Reference Point Session Dosage Given: 2.5 Gy
Session Number: 24

## 2022-11-16 LAB — CBC (CANCER CENTER ONLY)
HCT: 38.5 % — ABNORMAL LOW (ref 39.0–52.0)
Hemoglobin: 12.2 g/dL — ABNORMAL LOW (ref 13.0–17.0)
MCH: 25.5 pg — ABNORMAL LOW (ref 26.0–34.0)
MCHC: 31.7 g/dL (ref 30.0–36.0)
MCV: 80.4 fL (ref 80.0–100.0)
Platelet Count: 117 10*3/uL — ABNORMAL LOW (ref 150–400)
RBC: 4.79 MIL/uL (ref 4.22–5.81)
RDW: 14 % (ref 11.5–15.5)
WBC Count: 2.8 10*3/uL — ABNORMAL LOW (ref 4.0–10.5)
nRBC: 0 % (ref 0.0–0.2)

## 2022-11-17 ENCOUNTER — Ambulatory Visit
Admission: RE | Admit: 2022-11-17 | Discharge: 2022-11-17 | Disposition: A | Discharge status: Home / Self Care | Payer: Medicare HMO | Source: Ambulatory Visit | Attending: Radiation Oncology | Admitting: Radiation Oncology

## 2022-11-17 ENCOUNTER — Ambulatory Visit: Payer: Medicare HMO

## 2022-11-17 ENCOUNTER — Other Ambulatory Visit: Payer: Self-pay

## 2022-11-17 DIAGNOSIS — C61 Malignant neoplasm of prostate: Secondary | ICD-10-CM | POA: Diagnosis not present

## 2022-11-17 LAB — RAD ONC ARIA SESSION SUMMARY
Course Elapsed Days: 35
Plan Fractions Treated to Date: 25
Plan Prescribed Dose Per Fraction: 2.5 Gy
Plan Total Fractions Prescribed: 28
Plan Total Prescribed Dose: 70 Gy
Reference Point Dosage Given to Date: 62.5 Gy
Reference Point Session Dosage Given: 2.5 Gy
Session Number: 25

## 2022-11-18 ENCOUNTER — Other Ambulatory Visit: Payer: Self-pay

## 2022-11-18 ENCOUNTER — Ambulatory Visit: Payer: Medicare HMO

## 2022-11-18 ENCOUNTER — Ambulatory Visit
Admission: RE | Admit: 2022-11-18 | Discharge: 2022-11-18 | Disposition: A | Discharge status: Home / Self Care | Payer: Medicare HMO | Source: Ambulatory Visit | Attending: Radiation Oncology | Admitting: Radiation Oncology

## 2022-11-18 DIAGNOSIS — C61 Malignant neoplasm of prostate: Secondary | ICD-10-CM | POA: Diagnosis not present

## 2022-11-18 LAB — RAD ONC ARIA SESSION SUMMARY
Course Elapsed Days: 36
Plan Fractions Treated to Date: 26
Plan Prescribed Dose Per Fraction: 2.5 Gy
Plan Total Fractions Prescribed: 28
Plan Total Prescribed Dose: 70 Gy
Reference Point Dosage Given to Date: 65 Gy
Reference Point Session Dosage Given: 2.5 Gy
Session Number: 26

## 2022-11-21 ENCOUNTER — Ambulatory Visit: Payer: Medicare HMO

## 2022-11-21 ENCOUNTER — Ambulatory Visit
Admission: RE | Admit: 2022-11-21 | Discharge: 2022-11-21 | Disposition: A | Discharge status: Home / Self Care | Payer: Medicare HMO | Source: Ambulatory Visit | Attending: Radiation Oncology | Admitting: Radiation Oncology

## 2022-11-21 ENCOUNTER — Other Ambulatory Visit: Payer: Self-pay

## 2022-11-21 DIAGNOSIS — C61 Malignant neoplasm of prostate: Secondary | ICD-10-CM | POA: Diagnosis not present

## 2022-11-21 LAB — RAD ONC ARIA SESSION SUMMARY
Course Elapsed Days: 39
Plan Fractions Treated to Date: 27
Plan Prescribed Dose Per Fraction: 2.5 Gy
Plan Total Fractions Prescribed: 28
Plan Total Prescribed Dose: 70 Gy
Reference Point Dosage Given to Date: 67.5 Gy
Reference Point Session Dosage Given: 2.5 Gy
Session Number: 27

## 2022-11-22 ENCOUNTER — Ambulatory Visit
Admission: RE | Admit: 2022-11-22 | Discharge: 2022-11-22 | Disposition: A | Discharge status: Home / Self Care | Payer: Medicare HMO | Source: Ambulatory Visit | Attending: Radiation Oncology | Admitting: Radiation Oncology

## 2022-11-22 ENCOUNTER — Ambulatory Visit: Payer: Medicare HMO

## 2022-11-22 ENCOUNTER — Other Ambulatory Visit: Payer: Self-pay

## 2022-11-22 DIAGNOSIS — C61 Malignant neoplasm of prostate: Secondary | ICD-10-CM | POA: Diagnosis not present

## 2022-11-22 LAB — RAD ONC ARIA SESSION SUMMARY
Course Elapsed Days: 40
Plan Fractions Treated to Date: 28
Plan Prescribed Dose Per Fraction: 2.5 Gy
Plan Total Fractions Prescribed: 28
Plan Total Prescribed Dose: 70 Gy
Reference Point Dosage Given to Date: 70 Gy
Reference Point Session Dosage Given: 2.5 Gy
Session Number: 28

## 2022-11-23 ENCOUNTER — Ambulatory Visit: Payer: Medicare HMO

## 2022-11-24 ENCOUNTER — Ambulatory Visit: Payer: Medicare HMO

## 2022-11-25 ENCOUNTER — Ambulatory Visit: Payer: Medicare HMO

## 2022-11-28 ENCOUNTER — Ambulatory Visit: Payer: Medicare HMO

## 2022-11-29 ENCOUNTER — Ambulatory Visit: Payer: Medicare HMO

## 2022-11-30 ENCOUNTER — Ambulatory Visit: Payer: Medicare HMO

## 2022-12-01 ENCOUNTER — Ambulatory Visit: Payer: Medicare HMO

## 2022-12-02 ENCOUNTER — Ambulatory Visit: Payer: Medicare HMO

## 2022-12-05 ENCOUNTER — Ambulatory Visit: Payer: Medicare HMO

## 2022-12-06 ENCOUNTER — Ambulatory Visit: Payer: Medicare HMO

## 2022-12-07 ENCOUNTER — Ambulatory Visit: Payer: Medicare HMO

## 2022-12-28 ENCOUNTER — Other Ambulatory Visit: Payer: Self-pay | Admitting: *Deleted

## 2022-12-28 ENCOUNTER — Encounter: Payer: Self-pay | Admitting: Radiation Oncology

## 2022-12-28 ENCOUNTER — Ambulatory Visit
Admission: RE | Admit: 2022-12-28 | Discharge: 2022-12-28 | Disposition: A | Discharge status: Home / Self Care | Payer: Medicare HMO | Source: Ambulatory Visit | Attending: Radiation Oncology | Admitting: Radiation Oncology

## 2022-12-28 VITALS — BP 164/89 | HR 61 | Temp 98.1°F | Wt 187.0 lb

## 2022-12-28 DIAGNOSIS — C61 Malignant neoplasm of prostate: Secondary | ICD-10-CM | POA: Diagnosis present

## 2022-12-28 NOTE — Progress Notes (Signed)
Radiation Oncology Follow up Note  Name: Lee Jenkins   Date:   12/28/2022 MRN:  [id number] DOB: 06-09-1955    This 67 y.o. male presents to the clinic today for 1 month follow-up status post IMRT radiation therapy to his prostate for stage IIIb (T3a N0 M0 Gleason 7 (3+4) adenocarcinoma the prostate.  REFERRING PROVIDER: Leanord Asal, Consuelo *  HPI: Patient is a 67 year old male now out 1 month having completed IMRT radiation therapy for Gleason 7 adenocarcinoma the prostate.  Seen today in routine follow-up he is doing well specifically denies any increased lower urinary tract symptoms diarrhea or fatigue..  COMPLICATIONS OF TREATMENT: none  FOLLOW UP COMPLIANCE: keeps appointments   PHYSICAL EXAM:  BP (!) 164/89 Comment: patient will monitor pressure at home report to PCP if it remains elevated  Pulse 61   Temp 98.1 F (36.7 C) (Tympanic)   Wt 187 lb (84.8 kg)   BMI 26.83 kg/m  Well-developed well-nourished patient in NAD. HEENT reveals PERLA, EOMI, discs not visualized.  Oral cavity is clear. No oral mucosal lesions are identified. Neck is clear without evidence of cervical or supraclavicular adenopathy. Lungs are clear to A&P. Cardiac examination is essentially unremarkable with regular rate and rhythm without murmur rub or thrill. Abdomen is benign with no organomegaly or masses noted. Motor sensory and DTR levels are equal and symmetric in the upper and lower extremities. Cranial nerves II through XII are grossly intact. Proprioception is intact. No peripheral adenopathy or edema is identified. No motor or sensory levels are noted. Crude visual fields are within normal range.  RADIOLOGY RESULTS: No current films to review  PLAN: Prostate Adenocarcinoma (Stage 3B, T3A, N0, M0, Gleason 7 (3+4)) Completed IMRT radiation therapy one month ago.Initial PSA was 5.6. No current urinary or bowel symptoms. -Return in three months for follow-up. -Draw PSA one week prior to next  appointment. -PSA should decrease and remain low, indicating successful treatment.    Carmina Miller, MD

## 2023-03-21 ENCOUNTER — Inpatient Hospital Stay: Payer: Self-pay

## 2023-03-27 ENCOUNTER — Inpatient Hospital Stay: Payer: Self-pay | Attending: Radiation Oncology

## 2023-03-27 DIAGNOSIS — C61 Malignant neoplasm of prostate: Secondary | ICD-10-CM | POA: Diagnosis present

## 2023-03-27 LAB — PSA: Prostatic Specific Antigen: 0.06 ng/mL (ref 0.00–4.00)

## 2023-03-27 LAB — CBC (CANCER CENTER ONLY)
HCT: 37.1 % — ABNORMAL LOW (ref 39.0–52.0)
Hemoglobin: 11.9 g/dL — ABNORMAL LOW (ref 13.0–17.0)
MCH: 26.1 pg (ref 26.0–34.0)
MCHC: 32.1 g/dL (ref 30.0–36.0)
MCV: 81.4 fL (ref 80.0–100.0)
Platelet Count: 136 10*3/uL — ABNORMAL LOW (ref 150–400)
RBC: 4.56 MIL/uL (ref 4.22–5.81)
RDW: 13.7 % (ref 11.5–15.5)
WBC Count: 2.3 10*3/uL — ABNORMAL LOW (ref 4.0–10.5)
nRBC: 0 % (ref 0.0–0.2)

## 2023-03-29 ENCOUNTER — Ambulatory Visit
Admission: RE | Admit: 2023-03-29 | Discharge: 2023-03-29 | Disposition: A | Discharge status: Home / Self Care | Payer: Medicare HMO | Source: Ambulatory Visit | Attending: Radiation Oncology | Admitting: Radiation Oncology

## 2023-03-29 ENCOUNTER — Other Ambulatory Visit: Payer: Self-pay | Admitting: *Deleted

## 2023-03-29 ENCOUNTER — Encounter: Payer: Self-pay | Admitting: Radiation Oncology

## 2023-03-29 VITALS — BP 155/97 | Temp 98.6°F | Resp 16 | Wt 192.0 lb

## 2023-03-29 DIAGNOSIS — Z923 Personal history of irradiation: Secondary | ICD-10-CM | POA: Insufficient documentation

## 2023-03-29 DIAGNOSIS — C61 Malignant neoplasm of prostate: Secondary | ICD-10-CM | POA: Insufficient documentation

## 2023-03-29 NOTE — Progress Notes (Signed)
 Radiation Oncology Follow up Note  Name: Felicia Both   Date:   03/29/2023 MRN:  [id number] DOB: Nov 28, 1955    This 68 y.o. male presents to the clinic today for 63-month follow-up status post IMRT image guided radiation therapy to his prostate for stage IIIb (T3a N0 M0) Gleason 7 (3+4) adenocarcinoma the prostate.Marland Kitchen  REFERRING PROVIDER: Leanord Asal, Consuelo *  HPI: Patient is a 68 year old male now out for months having completed image guided IMRT radiation therapy for Gleason 7 adenocarcinoma the prostate.  Seen today in routine follow-up he is doing well.  He specifically denies any increased lower urinary tract symptoms diarrhea or fatigue.Marland Kitchen  His most recent PSA is 0.06 showing excellent biochemical control of his cancer.  COMPLICATIONS OF TREATMENT: none  FOLLOW UP COMPLIANCE: keeps appointments   PHYSICAL EXAM:  BP (!) 155/97   Temp 98.6 F (37 C) (Temporal)   Resp 16   Wt 192 lb (87.1 kg)   BMI 27.55 kg/m  Well-developed well-nourished patient in NAD. HEENT reveals PERLA, EOMI, discs not visualized.  Oral cavity is clear. No oral mucosal lesions are identified. Neck is clear without evidence of cervical or supraclavicular adenopathy. Lungs are clear to A&P. Cardiac examination is essentially unremarkable with regular rate and rhythm without murmur rub or thrill. Abdomen is benign with no organomegaly or masses noted. Motor sensory and DTR levels are equal and symmetric in the upper and lower extremities. Cranial nerves II through XII are grossly intact. Proprioception is intact. No peripheral adenopathy or edema is identified. No motor or sensory levels are noted. Crude visual fields are within normal range.  RADIOLOGY RESULTS: No current films for review  PLAN: Present time patient is under excellent biochemical control of his prostate cancer.  I am pleased with his overall tolerance and side effect profile.  I have asked to see him back in 6 months for follow-up.  Patient knows to  call with any concerns.  I would like to take this opportunity to thank you for allowing me to participate in the care of your patient.Carmina Miller, MD

## 2023-07-16 ENCOUNTER — Emergency Department

## 2023-07-16 ENCOUNTER — Emergency Department
Admission: EM | Admit: 2023-07-16 | Discharge: 2023-07-16 | Disposition: A | Discharge status: Home / Self Care | Attending: Emergency Medicine | Admitting: Emergency Medicine

## 2023-07-16 DIAGNOSIS — R109 Unspecified abdominal pain: Secondary | ICD-10-CM | POA: Diagnosis not present

## 2023-07-16 DIAGNOSIS — I10 Essential (primary) hypertension: Secondary | ICD-10-CM | POA: Diagnosis not present

## 2023-07-16 DIAGNOSIS — Z96642 Presence of left artificial hip joint: Secondary | ICD-10-CM | POA: Insufficient documentation

## 2023-07-16 DIAGNOSIS — M545 Low back pain, unspecified: Secondary | ICD-10-CM | POA: Insufficient documentation

## 2023-07-16 LAB — URINALYSIS, ROUTINE W REFLEX MICROSCOPIC
Bacteria, UA: NONE SEEN
Bilirubin Urine: NEGATIVE
Glucose, UA: NEGATIVE mg/dL
Ketones, ur: NEGATIVE mg/dL
Leukocytes,Ua: NEGATIVE
Nitrite: NEGATIVE
Protein, ur: NEGATIVE mg/dL
Specific Gravity, Urine: 1.024 (ref 1.005–1.030)
Squamous Epithelial / HPF: 0 /HPF (ref 0–5)
pH: 5 (ref 5.0–8.0)

## 2023-07-16 MED ORDER — LIDOCAINE 5 % EX PTCH
1.0000 | MEDICATED_PATCH | Freq: Once | CUTANEOUS | Status: DC
  Administered 2023-07-16: 1 via TRANSDERMAL
  Filled 2023-07-16: qty 1

## 2023-07-16 NOTE — ED Triage Notes (Signed)
 Pt comes with right lower back pain. Pt denies any injuries. Pt denies radiation down leg. Pt denies any urinary symptoms.

## 2023-07-16 NOTE — ED Provider Notes (Signed)
 [facility] Emergency Department Provider Note     Event Date/Time   First MD Initiated Contact with Patient 07/16/23 1252     (approximate)   History   Back Pain   HPI  Lee Jenkins is a 68 y.o. male , with a history of HTN, arthritis, heart murmur, left hip osteoarthritis, and left hip total arthroplasty, presents endorsing right-sided low back pain.  Patient denies any recent injury, trauma, or falls.  No reports of any radiation to the lower leg, groin, or crotch no bladder or bowel incontinence, foot drop, saddle anesthesia.     Physical Exam   Triage Vital Signs: ED Triage Vitals  Encounter Vitals Group     BP 07/16/23 1229 (!) 158/98     Girls Systolic BP Percentile --      Girls Diastolic BP Percentile --      Boys Systolic BP Percentile --      Boys Diastolic BP Percentile --      Pulse Rate 07/16/23 1229 76     Resp 07/16/23 1229 18     Temp 07/16/23 1229 98 F (36.7 C)     Temp src --      SpO2 07/16/23 1229 100 %     Weight 07/16/23 1228 192 lb (87.1 kg)     Height 07/16/23 1228 6' 1 (1.854 m)     Head Circumference --      Peak Flow --      Pain Score 07/16/23 1228 7     Pain Loc --      Pain Education --      Exclude from Growth Chart --     Most recent vital signs: Vitals:   07/16/23 1229  BP: (!) 158/98  Pulse: 76  Resp: 18  Temp: 98 F (36.7 C)  SpO2: 100%    General Awake, no distress. NAD HEENT NCAT. PERRL. EOMI. No rhinorrhea. Mucous membranes are moist.  CV:  Good peripheral perfusion. RRR RESP:  Normal effort. CTA ABD:  No distention.  Soft and nontender.  Mild tender to palpation to right flank region.  No rebound, guarding, rigidity noted. MSK:  Normal spinal alignment without midline tenderness, spasm, deformity, or step-off. NEURO: Cranial nerves II to XII grossly intact.  ED Results / Procedures / Treatments   Labs (all labs ordered are listed, but only abnormal results are displayed) Labs  Reviewed  URINALYSIS, ROUTINE W REFLEX MICROSCOPIC - Abnormal; Notable for the following components:      Result Value   Color, Urine YELLOW (*)    APPearance CLEAR (*)    Hgb urine dipstick MODERATE (*)    All other components within normal limits    EKG   RADIOLOGY  I personally viewed and evaluated these images as part of my medical decision making, as well as reviewing the written report by the radiologist.  ED Provider Interpretation: No acute findings  DG Lumbar Spine 2-3 Views Result Date: 07/16/2023 CLINICAL DATA:  Back pain. EXAM: LUMBAR SPINE - 2-3 VIEW COMPARISON:  CT AP from 02/26/2021 FINDINGS: There is a moderate curvature of the lumbar spine which is convex towards the right. Marked multi level disc space narrowing and endplate spurring is identified. No sign of acute fracture or subluxation. Lower lumbar spine facet arthropathy. Previous left hip arthroplasty. IMPRESSION: 1. No acute findings. 2. Moderate lumbar dextroscoliosis and advanced multilevel degenerative disc disease. Electronically Signed   By: Waddell Calk M.D.   On:  07/16/2023 13:00   CT Renal Stone Study  IMPRESSION: 1. Stable distal pancreatic ductal dilatation without evidence of surrounding inflammatory changes. 2. Stable right renal cyst. No follow-up imaging is recommended. This recommendation follows ACR consensus guidelines: Management of the Incidental Renal Mass on CT: A White Paper of the ACR Incidental Findings Committee. J Am Coll Radiol [phone]. 3. Mild diffuse urinary bladder wall thickening which may be secondary to poor distention. Correlation with urinalysis is recommended to exclude the presence of cystitis. 4. Total left hip replacement. 5. Moderate severity dextroscoliosis of the lumbar spine with multilevel degenerative changes.    PROCEDURES:  Critical Care performed: No  Procedures   MEDICATIONS ORDERED IN ED: Medications - No data to display   IMPRESSION  / MDM / ASSESSMENT AND PLAN / ED COURSE  I reviewed the triage vital signs and the nursing notes.                              Differential diagnosis includes, but is not limited to, DDD, lumbar radiculopathy, lumbago, myalgias, nephrolithiasis, flank pain, UTI, pyelonephritis  Patient's presentation is most consistent with acute, uncomplicated illness.  Patient's diagnosis is consistent with acute right-sided low back pain without sciatica and flank discomfort.  Patient with reassuring exam and workup at this time.  No UA evidence of any leukocytosis.  Moderate hemoglobin is noted.  CT renal stone reviewed by me, shows no acute nephrolithiasis or pyelonephritis.  Plain lumbar films turbid by me, negative for any acute bony findings.  No red flags on exam.  Patient is neurovascularly intact with no reports of any urinary retention, hematuria, urgency or frequency.  Patient will be discharged home with prescriptions for Lidoderm patches. Patient is to follow up with primary provider as suggested, as needed or otherwise directed. Patient is given ED precautions to return to the ED for any worsening or new symptoms.     FINAL CLINICAL IMPRESSION(S) / ED DIAGNOSES   Final diagnoses:  Acute right-sided low back pain without sciatica  Flank pain     Rx / DC Orders   ED Discharge Orders     None        Note:  This document was prepared using Dragon voice recognition software and may include unintentional dictation errors.    Loyd Candida LULLA Aldona, PA-C 07/17/23 ZELPHA    Suzanne Kirsch, MD 07/17/23 [phone]

## 2023-07-16 NOTE — Discharge Instructions (Signed)
 Your exam, labs, XR, and CT scans are essentially normal. No evidence of a kidney stone, kidney infection, or colitis. Take OTC Tylenol  or Motrin as needed. Consider OTC Miralax for daily stool softening.

## 2023-09-20 ENCOUNTER — Inpatient Hospital Stay: Attending: Radiation Oncology

## 2023-09-20 DIAGNOSIS — C61 Malignant neoplasm of prostate: Secondary | ICD-10-CM | POA: Insufficient documentation

## 2023-09-20 LAB — PSA: Prostatic Specific Antigen: 0.2 ng/mL (ref 0.00–4.00)

## 2023-09-27 ENCOUNTER — Ambulatory Visit
Admission: RE | Admit: 2023-09-27 | Discharge: 2023-09-27 | Disposition: A | Discharge status: Home / Self Care | Source: Ambulatory Visit | Attending: Radiation Oncology | Admitting: Radiation Oncology

## 2023-09-27 ENCOUNTER — Encounter: Payer: Self-pay | Admitting: Radiation Oncology

## 2023-09-27 VITALS — BP 132/95 | HR 59 | Resp 15 | Ht 73.0 in | Wt 197.1 lb

## 2023-09-27 DIAGNOSIS — C61 Malignant neoplasm of prostate: Secondary | ICD-10-CM | POA: Diagnosis present

## 2023-09-27 DIAGNOSIS — Z923 Personal history of irradiation: Secondary | ICD-10-CM | POA: Diagnosis not present

## 2023-09-27 NOTE — Progress Notes (Signed)
 Radiation Oncology Follow up Note  Name: Lee Jenkins   Date:   09/27/2023 MRN:  [id number] DOB: October 19, 1955    This 67 y.o. 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  REFERRING PROVIDER: Odell Chard, Consuelo *  HPI: Patient is a 68 year old male now out 10 months having completed image guided IMRT radiation therapy to his prostate for Gleason 7 adenocarcinoma.  Seen today in routine follow-up he is doing well.  He specifically denies any increased lower Neri tract symptoms diarrhea or fatigue.SABRA  His most recent PSA is 0.2 up slightly from 0.066 months prior although still showing excellent biochemical control of his prostate cancer.  COMPLICATIONS OF TREATMENT: none  FOLLOW UP COMPLIANCE: keeps appointments   PHYSICAL EXAM:  BP (!) 132/95 Comment: Has not taken BP meds today  Pulse (!) 59   Resp 15   Ht 6' 1 (1.854 m)   Wt 197 lb 1.6 oz (89.4 kg)   BMI 26.00 kg/m  Well-developed well-nourished patient in NAD. HEENT reveals PERLA, EOMI, discs not visualized.  Oral cavity is clear. No oral mucosal lesions are identified. Neck is clear without evidence of cervical or supraclavicular adenopathy. Lungs are clear to A&P. Cardiac examination is essentially unremarkable with regular rate and rhythm without murmur rub or thrill. Abdomen is benign with no organomegaly or masses noted. Motor sensory and DTR levels are equal and symmetric in the upper and lower extremities. Cranial nerves II through XII are grossly intact. Proprioception is intact. No peripheral adenopathy or edema is identified. No motor or sensory levels are noted. Crude visual fields are within normal range.  RADIOLOGY RESULTS: No current films for his review  PLAN: Present time patient is doing well under excellent biochemical control of his prostate cancer slight uptake in his  PSA although minimal.  Will see him back in 6 months with repeat PSA.  Patient knows to call sooner with any concerns at any time.  I would like to take this opportunity to thank you for allowing me to participate in the care of your patient.SABRA Marcey Penton, MD

## 2023-09-29 IMAGING — CT CT ABD-PEL WO/W CM
3 of 13 series · 11 of 46 positions shown, 17 images · IV contrast (agent unspecified)
Comparison: None.

CLINICAL DATA: Microhematuria.

EXAM:
CT ABDOMEN AND PELVIS WITHOUT AND WITH CONTRAST
TECHNIQUE: Multidetector CT imaging of the abdomen and pelvis was performed
following the standard protocol before and following the bolus
administration of intravenous contrast.

[Series 2: axial pre · axial · non-contrast · 0.72mm/px · z∈[-949,-804]mm · 3 of 87 slices shown]
[im 15/87  soft-tissue]
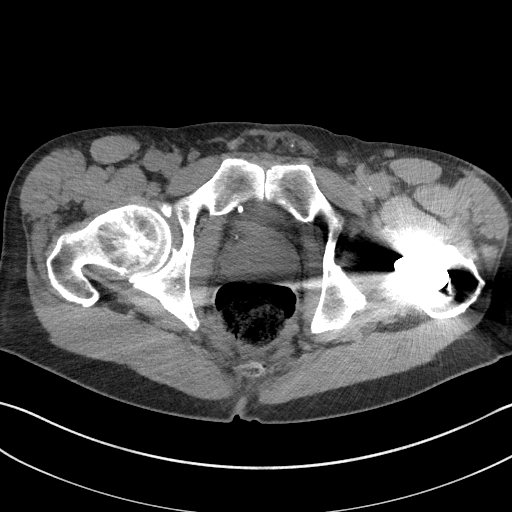
[im 29/87  soft-tissue]
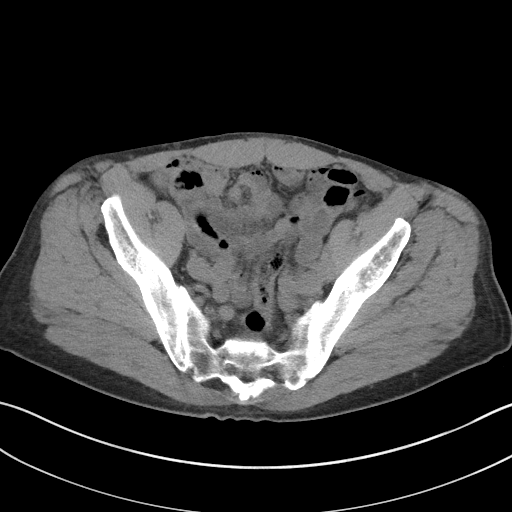
[im 44/87  soft-tissue]
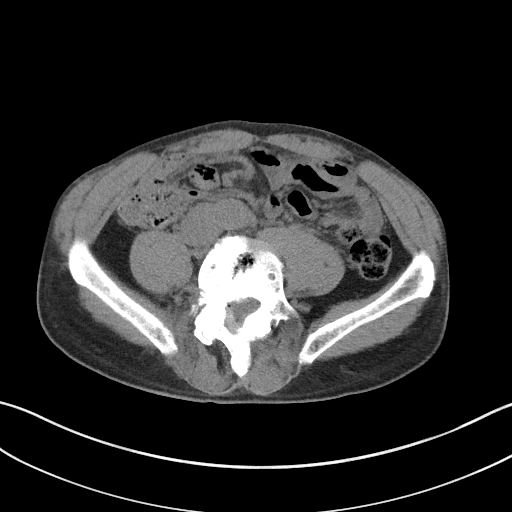

[Series 4: coronal pre · coronal · non-contrast · 0.66mm/px · 2 of 90 slices shown, 3 images]
[im 30/90  soft-tissue]
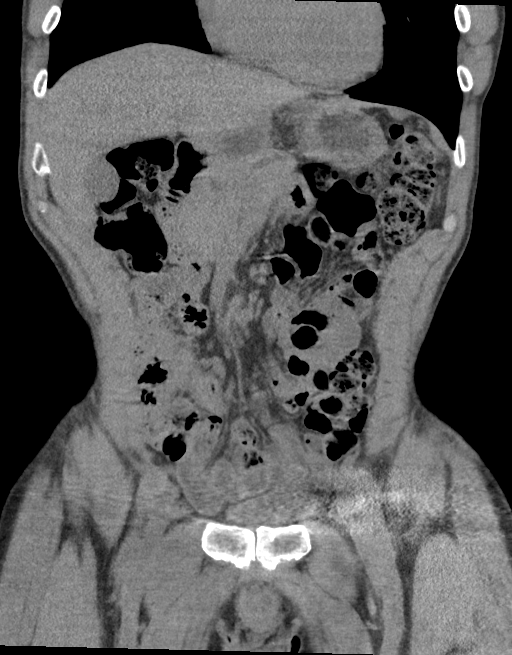
[im 30/90  bone]
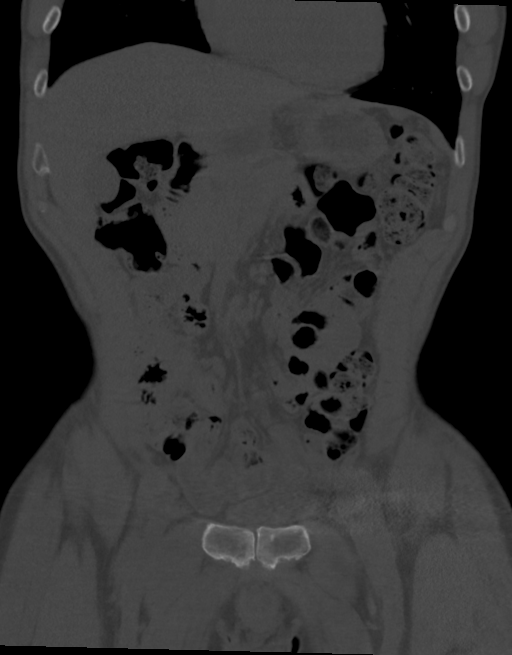
[im 60/90  soft-tissue]
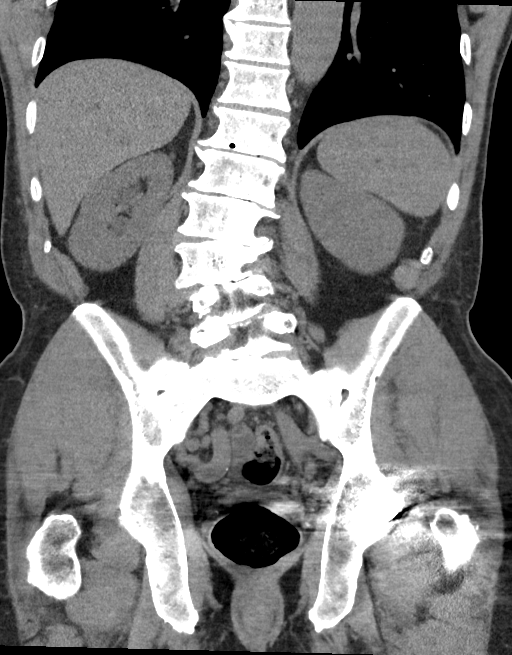

[Series 7: axial post · axial · 0.76mm/px · z∈[-953,-648]mm · 6 of 87 slices shown, 11 images]
[im 13/87  soft-tissue]
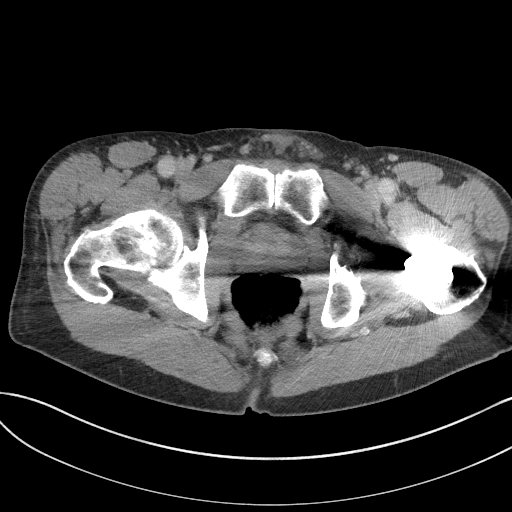
[im 13/87  bone]
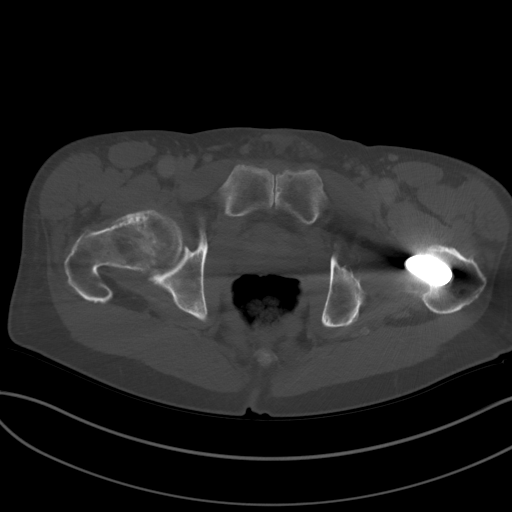
[im 25/87  soft-tissue]
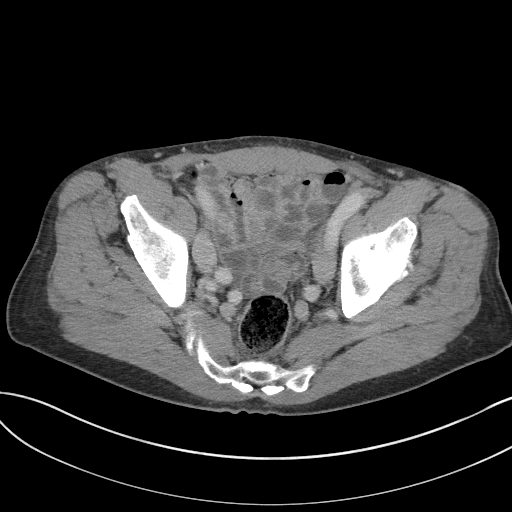
[im 37/87  soft-tissue]
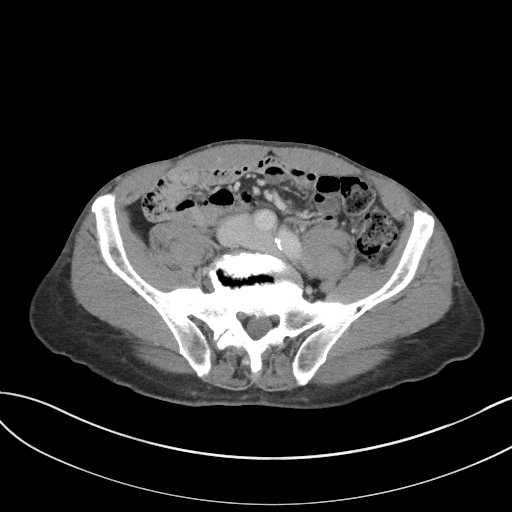
[im 37/87  lung]
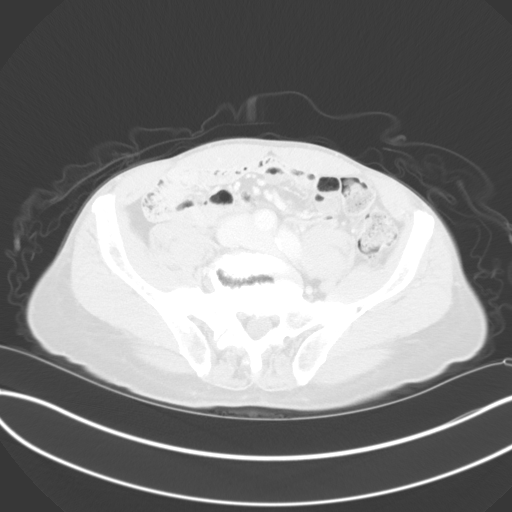
[im 50/87  soft-tissue]
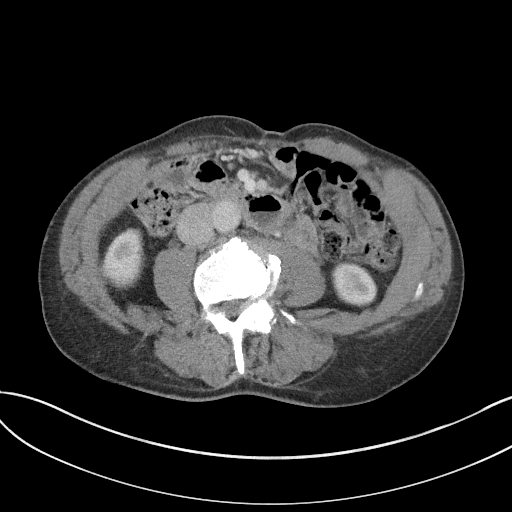
[im 50/87  lung]
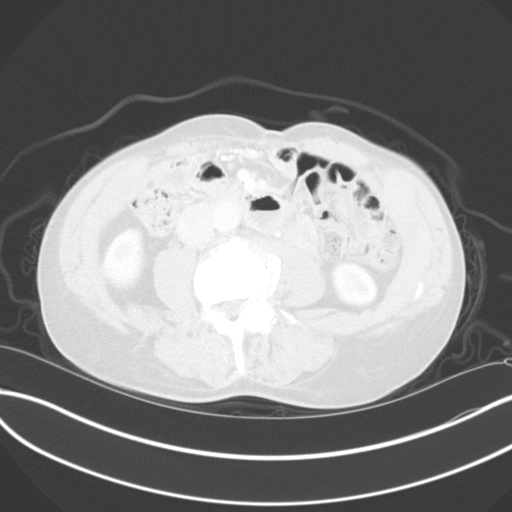
[im 62/87  soft-tissue]
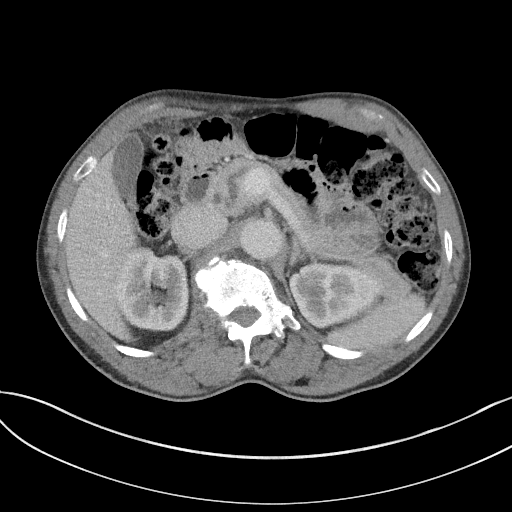
[im 62/87  lung]
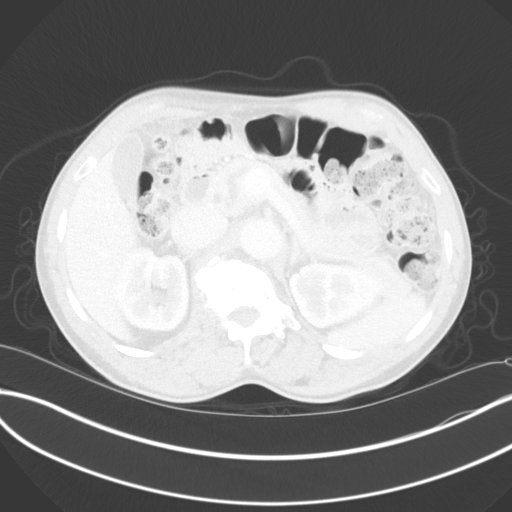
[im 74/87  soft-tissue]
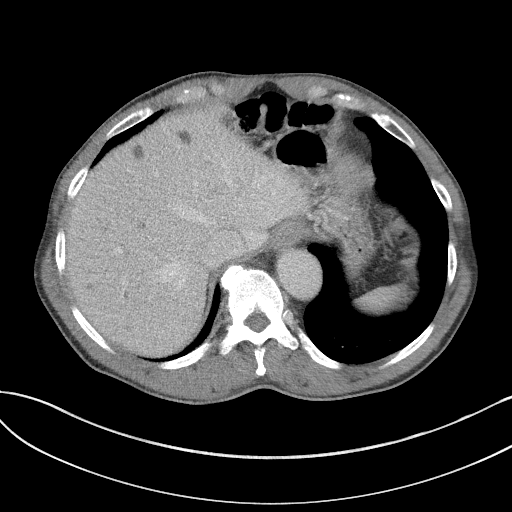
[im 74/87  lung]
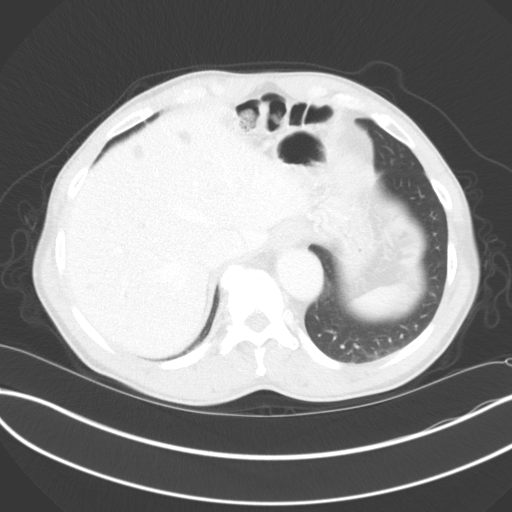

[11 of 46 positions shown; findings below may reference images not displayed]

RADIATION DOSE REDUCTION: This exam was performed according to the
departmental dose-optimization program which includes automated
exposure control, adjustment of the mA and/or kV according to
patient size and/or use of iterative reconstruction technique.

CONTRAST:  100mL OMNIPAQUE IOHEXOL 300 MG/ML  SOLN
FINDINGS: Lower chest: Clear lung bases. No significant pleural or pericardial
effusion.

Hepatobiliary: There are numerous small low-density hepatic lesions
which are likely incidental cysts or biliary hamartomas. No
suspicious hepatic findings. No evidence of gallstones, gallbladder
wall thickening or biliary dilatation.

Pancreas: Mild pancreatic ductal dilatation to 6 mm in the
pancreatic head. No evidence of pancreatic head mass, surrounding
inflammation or fluid collection. There are no pancreatic
calcifications.

Spleen: Normal in size without focal abnormality.

Adrenals/Urinary Tract: Both adrenal glands appear normal.
Pre-contrast images demonstrate no renal, ureteral or bladder
calculi. Post-contrast, both kidneys enhance normally. There is no
evidence of enhancing renal mass. There is a cyst in the anterior
interpolar region of the right kidney which measures 2.2 cm on image
25/14. Delayed images result in segmental visualization of the
ureters. No focal upper tract urothelial abnormalities are
identified. No focal bladder abnormalities are identified, although
the bladder is partly obscured by artifact from the patient's left
total hip arthroplasty.

Stomach/Bowel: No enteric contrast administered. The stomach appears
unremarkable for its degree of distension. No evidence of bowel wall
thickening, distention or surrounding inflammatory change.

Vascular/Lymphatic: There are no enlarged abdominal or pelvic lymph
nodes. Aortic and branch vessel atherosclerosis without acute
vascular findings.

Reproductive: Mild enlargement of the prostate gland.

Other: No evidence of abdominal wall mass or hernia. No ascites.

Musculoskeletal: No acute osseous findings. There is marked
multilevel spondylosis with a convex right lumbar scoliosis. There
is the potential for foraminal narrowing at multiple levels. The
sacroiliac joints are partially ankylosed. Previous left total hip
arthroplasty. Mild right hip degenerative changes.
IMPRESSION: 1. No clear cause for hematuria identified. The bladder is partly
obscured by artifact from the left total hip arthroplasty. Further
bladder evaluation may be warranted if the patient has persistent
unexplained hematuria.
2. Numerous small low-density hepatic lesions, likely cysts or
biliary hamartomas. Small right renal cyst.
3. Mild nonspecific pancreatic ductal dilatation without evidence of
acute inflammation or mass lesion.
4. Multilevel lumbar spondylosis associated with a scoliosis.
5.  Aortic Atherosclerosis ([id number]).

## 2024-03-28 ENCOUNTER — Other Ambulatory Visit

## 2024-04-04 ENCOUNTER — Ambulatory Visit: Admitting: Radiation Oncology
